# Patient Record
Sex: Female | Born: 1985 | Race: White | Hispanic: No | Marital: Married | Smoking: Former smoker
Health system: Southern US, Community
[De-identification: ages and names within clinical notes are randomized; demographics above are authoritative.]

## PROBLEM LIST (undated history)

## (undated) DIAGNOSIS — T7840XA Allergy, unspecified, initial encounter: Secondary | ICD-10-CM

## (undated) DIAGNOSIS — M1990 Unspecified osteoarthritis, unspecified site: Secondary | ICD-10-CM

## (undated) DIAGNOSIS — I10 Essential (primary) hypertension: Secondary | ICD-10-CM

## (undated) DIAGNOSIS — J45909 Unspecified asthma, uncomplicated: Secondary | ICD-10-CM

## (undated) DIAGNOSIS — J40 Bronchitis, not specified as acute or chronic: Secondary | ICD-10-CM

## (undated) HISTORY — DX: Unspecified osteoarthritis, unspecified site: M19.90

## (undated) HISTORY — PX: WISDOM TOOTH EXTRACTION: SHX21

## (undated) HISTORY — DX: Allergy, unspecified, initial encounter: T78.40XA

## (undated) HISTORY — DX: Essential (primary) hypertension: I10

## (undated) HISTORY — DX: Bronchitis, not specified as acute or chronic: J40

---
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
## 2008-05-25 ENCOUNTER — Emergency Department: Payer: Self-pay | Admitting: Emergency Medicine

## 2008-07-17 ENCOUNTER — Emergency Department: Payer: Self-pay | Admitting: Emergency Medicine

## 2008-08-19 ENCOUNTER — Emergency Department: Payer: Self-pay | Admitting: Emergency Medicine

## 2009-04-05 ENCOUNTER — Emergency Department: Payer: Self-pay | Admitting: Emergency Medicine

## 2009-07-06 ENCOUNTER — Emergency Department: Payer: Self-pay | Admitting: Emergency Medicine

## 2009-07-08 ENCOUNTER — Emergency Department: Payer: Self-pay | Admitting: Emergency Medicine

## 2009-09-05 ENCOUNTER — Emergency Department: Payer: Self-pay | Admitting: Emergency Medicine

## 2009-12-29 ENCOUNTER — Emergency Department: Payer: Self-pay | Admitting: Unknown Physician Specialty

## 2010-01-06 ENCOUNTER — Ambulatory Visit: Payer: Self-pay | Admitting: Internal Medicine

## 2010-01-15 ENCOUNTER — Ambulatory Visit: Payer: Self-pay | Admitting: Internal Medicine

## 2013-02-07 ENCOUNTER — Emergency Department: Payer: Self-pay | Admitting: Emergency Medicine

## 2013-02-07 LAB — COMPREHENSIVE METABOLIC PANEL
ALT: 27 U/L (ref 12–78)
ANION GAP: 6 — AB (ref 7–16)
Albumin: 3.4 g/dL (ref 3.4–5.0)
Alkaline Phosphatase: 64 U/L
BUN: 11 mg/dL (ref 7–18)
Bilirubin,Total: 0.2 mg/dL (ref 0.2–1.0)
CO2: 25 mmol/L (ref 21–32)
Calcium, Total: 8.4 mg/dL — ABNORMAL LOW (ref 8.5–10.1)
Chloride: 105 mmol/L (ref 98–107)
Creatinine: 0.54 mg/dL — ABNORMAL LOW (ref 0.60–1.30)
EGFR (African American): >60
EGFR (Non-African Amer.): >60
Glucose: 95 mg/dL (ref 65–99)
Osmolality: 271 (ref 275–301)
Potassium: 3.7 mmol/L (ref 3.5–5.1)
SGOT(AST): 19 U/L (ref 15–37)
Sodium: 136 mmol/L (ref 136–145)
Total Protein: 6.8 g/dL (ref 6.4–8.2)

## 2013-02-07 LAB — URINALYSIS, COMPLETE
BLOOD: NEGATIVE
Bacteria: NONE SEEN
Bilirubin,UR: NEGATIVE
Glucose,UR: NEGATIVE mg/dL (ref 0–75)
NITRITE: NEGATIVE
Ph: 5 (ref 4.5–8.0)
RBC,UR: NONE SEEN /HPF (ref 0–5)
SPECIFIC GRAVITY: 1.038 (ref 1.003–1.030)
Squamous Epithelial: 6

## 2013-02-07 LAB — CBC WITH DIFFERENTIAL/PLATELET
Basophil #: 0 10*3/uL (ref 0.0–0.1)
Basophil %: 0.5 %
Eosinophil #: 0.1 10*3/uL (ref 0.0–0.7)
Eosinophil %: 1.1 %
HCT: 36 % (ref 35.0–47.0)
HGB: 12 g/dL (ref 12.0–16.0)
Lymphocyte #: 2.6 10*3/uL (ref 1.0–3.6)
Lymphocyte %: 27.4 %
MCH: 29.9 pg (ref 26.0–34.0)
MCHC: 33.2 g/dL (ref 32.0–36.0)
MCV: 90 fL (ref 80–100)
MONO ABS: 0.6 x10 3/mm (ref 0.2–0.9)
Monocyte %: 5.9 %
NEUTROS ABS: 6.3 10*3/uL (ref 1.4–6.5)
NEUTROS PCT: 65.1 %
PLATELETS: 208 10*3/uL (ref 150–440)
RBC: 4.01 10*6/uL (ref 3.80–5.20)
RDW: 12.9 % (ref 11.5–14.5)
WBC: 9.6 10*3/uL (ref 3.6–11.0)

## 2013-02-07 LAB — HCG, QUANTITATIVE, PREGNANCY: Beta Hcg, Quant.: 32953 m[IU]/mL — ABNORMAL HIGH

## 2013-02-07 LAB — LIPASE, BLOOD: Lipase: 67 U/L — ABNORMAL LOW (ref 73–393)

## 2015-02-08 ENCOUNTER — Emergency Department
Admission: EM | Admit: 2015-02-08 | Discharge: 2015-02-08 | Disposition: A | Discharge status: Home / Self Care | Payer: Medicaid Other | Attending: Emergency Medicine | Admitting: Emergency Medicine

## 2015-02-08 ENCOUNTER — Encounter: Payer: Self-pay | Admitting: Emergency Medicine

## 2015-02-08 DIAGNOSIS — K2920 Alcoholic gastritis without bleeding: Secondary | ICD-10-CM | POA: Insufficient documentation

## 2015-02-08 DIAGNOSIS — F17200 Nicotine dependence, unspecified, uncomplicated: Secondary | ICD-10-CM | POA: Insufficient documentation

## 2015-02-08 DIAGNOSIS — R51 Headache: Secondary | ICD-10-CM | POA: Insufficient documentation

## 2015-02-08 MED ORDER — RANITIDINE HCL 150 MG PO TABS: 150.0000 mg | ORAL_TABLET | Freq: Two times a day (BID) | ORAL | Status: DC

## 2015-02-08 MED ORDER — ONDANSETRON HCL 4 MG/2ML IJ SOLN
4.0000 mg | Freq: Once | INTRAMUSCULAR | Status: AC
  Administered 2015-02-08: 4 mg via INTRAVENOUS
  Filled 2015-02-08: qty 2

## 2015-02-08 MED ORDER — SODIUM CHLORIDE 0.9 % IV BOLUS (SEPSIS)
1000.0000 mL | Freq: Once | INTRAVENOUS | Status: AC
  Administered 2015-02-08: 1000 mL via INTRAVENOUS

## 2015-02-08 MED ORDER — ONDANSETRON 4 MG PO TBDP: 4.0000 mg | ORAL_TABLET | Freq: Three times a day (TID) | ORAL | Status: DC | PRN

## 2015-02-08 NOTE — ED Notes (Signed)
Denies any other symptoms, multiple episodes of vomiting, no emesis while here. Sleeping in waiting room

## 2015-02-08 NOTE — ED Provider Notes (Signed)
[facility] Emergency Department Provider Note  ____________________________________________  Time seen: Approximately 4:00 PM  I have reviewed the triage vital signs and the nursing notes.   HISTORY  Chief Complaint Emesis   HPI Emma Hamilton is a 30 y.o. female is here with complaint of multiple episodes of vomiting. Patient admits to drinking numerous shots of a alcohol product last evening. She states that during the evening that she became belligerent with her boyfriend. She states she went to sleep and when she woke up and drank some water and then began drinking alcohol once again. At some point she went back to sleep and at one p.m. today woke up and was going to drive her boyfriend to work when she began vomiting once again. She denies any previous GI problems and denies any peptic ulcer disease. She has not taken any medication for vomiting.  Currently she feels nauseous. She denies possibility of pregnancy due to Implanon implant.   History reviewed. No pertinent past medical history.  There are no active problems to display for this patient.   History reviewed. No pertinent past surgical history.  Current Outpatient Rx  Name  Route  Sig  Dispense  Refill  . ondansetron (ZOFRAN ODT) 4 MG disintegrating tablet   Oral   Take 1 tablet (4 mg total) by mouth every 8 (eight) hours as needed for nausea or vomiting.   8 tablet   0   . ranitidine (ZANTAC) 150 MG tablet   Oral   Take 1 tablet (150 mg total) by mouth 2 (two) times daily.   14 tablet   1     Allergies Review of patient's allergies indicates no known allergies.  History reviewed. No pertinent family history.  Social History Social History  Substance Use Topics  . Smoking status: Current Every Day Smoker -- 0.50 packs/day  . Smokeless tobacco: None  . Alcohol Use: Yes    Review of Systems Constitutional: No fever/chills Eyes: No visual changes. ENT: No sore  throat. Cardiovascular: Denies chest pain. Respiratory: Denies shortness of breath. Gastrointestinal: No abdominal pain.  Positive nausea, positive vomiting.  No diarrhea.  No constipation. Genitourinary: Negative for dysuria. Musculoskeletal: Negative for back pain. Skin: Negative for rash. Neurological: Positive for headaches, no focal weakness or numbness.  10-point ROS otherwise negative.  ____________________________________________   PHYSICAL EXAM:  VITAL SIGNS: ED Triage Vitals  Enc Vitals Group     BP 02/08/15 1517 124/81 mmHg     Pulse Rate 02/08/15 1517 82     Resp 02/08/15 1517 16     Temp 02/08/15 1517 98.2 F (36.8 C)     Temp src --      SpO2 02/08/15 1517 100 %     Weight 02/08/15 1517 240 lb (108.863 kg)     Height 02/08/15 1517  (1.6 m)     Head Cir --      Peak Flow --      Pain Score --      Pain Loc --      Pain Edu? --      Excl. in GC? --     Constitutional: Alert and oriented. Well appearing and in no acute distress. Eyes: Conjunctivae are normal. PERRL. EOMI. Head: Atraumatic. Nose: No congestion/rhinnorhea. Mouth/Throat: Mucous membranes are moist.  Oropharynx non-erythematous. Neck: No stridor.   Hematological/Lymphatic/Immunilogical: No cervical lymphadenopathy. Cardiovascular: Normal rate, regular rhythm. Grossly normal heart sounds.  Good peripheral circulation. Respiratory: Normal respiratory effort.  No retractions. Lungs CTAB. Gastrointestinal: Soft and nontender. No distention.  Musculoskeletal: No lower extremity tenderness nor edema.  No joint effusions. Neurologic:  Normal speech and language. No gross focal neurologic deficits are appreciated. No gait instability. Skin:  Skin is warm, dry and intact. No rash noted. Psychiatric: Mood and affect are normal. Speech and behavior are normal.  ____________________________________________   LABS (all labs ordered are listed, but only abnormal results are displayed)  Labs  Reviewed - No data to display  PROCEDURES  Procedure(s) performed: None  Critical Care performed: No  ____________________________________________   INITIAL IMPRESSION / ASSESSMENT AND PLAN / ED COURSE  Pertinent labs & imaging results that were available during my care of the patient were reviewed by me and considered in my medical decision making (see chart for details).  ----------------------------------------- 5:36 PM on 02/08/2015 ----------------------------------------- No continued nausea or vomiting. Patient continues to have IV fluids and voices no other complaints.  Patient was given IV Zofran along with a liter of fluids and began feeling much better. She was given a prescription for Zofran and told no alcohol and even after a week only minimal amounts instead of the large amount but it sounds as she had last night. She is to follow-up with [facility] if any continued problems.  ____________________________________________   FINAL CLINICAL IMPRESSION(S) / ED DIAGNOSES  Final diagnoses:  Acute alcoholic gastritis without hemorrhage      Tommi Rumps, PA-C 02/08/15 1907  Myrna Blazer, MD 02/08/15 2241

## 2015-08-05 ENCOUNTER — Encounter: Payer: Self-pay | Admitting: Emergency Medicine

## 2015-08-05 ENCOUNTER — Emergency Department
Admission: EM | Admit: 2015-08-05 | Discharge: 2015-08-05 | Disposition: A | Discharge status: Home / Self Care | Payer: Medicaid Other | Attending: Emergency Medicine | Admitting: Emergency Medicine

## 2015-08-05 ENCOUNTER — Emergency Department: Payer: Medicaid Other

## 2015-08-05 DIAGNOSIS — J209 Acute bronchitis, unspecified: Secondary | ICD-10-CM | POA: Insufficient documentation

## 2015-08-05 DIAGNOSIS — J45909 Unspecified asthma, uncomplicated: Secondary | ICD-10-CM | POA: Insufficient documentation

## 2015-08-05 DIAGNOSIS — J40 Bronchitis, not specified as acute or chronic: Secondary | ICD-10-CM

## 2015-08-05 DIAGNOSIS — Z79899 Other long term (current) drug therapy: Secondary | ICD-10-CM | POA: Insufficient documentation

## 2015-08-05 DIAGNOSIS — R05 Cough: Secondary | ICD-10-CM

## 2015-08-05 DIAGNOSIS — R059 Cough, unspecified: Secondary | ICD-10-CM

## 2015-08-05 DIAGNOSIS — F17200 Nicotine dependence, unspecified, uncomplicated: Secondary | ICD-10-CM | POA: Insufficient documentation

## 2015-08-05 HISTORY — DX: Unspecified asthma, uncomplicated: J45.909

## 2015-08-05 MED ORDER — BENZONATATE 100 MG PO CAPS: 100.0000 mg | ORAL_CAPSULE | Freq: Four times a day (QID) | ORAL | Status: DC | PRN

## 2015-08-05 MED ORDER — ALBUTEROL SULFATE HFA 108 (90 BASE) MCG/ACT IN AERS: 2.0000 | INHALATION_SPRAY | Freq: Four times a day (QID) | RESPIRATORY_TRACT | Status: DC | PRN

## 2015-08-05 MED ORDER — PREDNISONE 20 MG PO TABS
60.0000 mg | ORAL_TABLET | Freq: Once | ORAL | Status: AC
  Administered 2015-08-05: 60 mg via ORAL
  Filled 2015-08-05: qty 3

## 2015-08-05 MED ORDER — IPRATROPIUM-ALBUTEROL 0.5-2.5 (3) MG/3ML IN SOLN
3.0000 mL | Freq: Once | RESPIRATORY_TRACT | Status: AC
  Administered 2015-08-05: 3 mL via RESPIRATORY_TRACT
  Filled 2015-08-05: qty 3

## 2015-08-05 MED ORDER — PREDNISONE 20 MG PO TABS: 60.0000 mg | ORAL_TABLET | Freq: Every day | ORAL | Status: DC

## 2015-08-05 NOTE — ED Notes (Signed)
Pt arrived to the ED accompanied by her husband for complaints of cough and vomiting. Pt is AOx4 in no apparent distress.

## 2015-08-05 NOTE — ED Provider Notes (Signed)
[facility] Emergency Department Provider Note   ____________________________________________  Time seen: Approximately 432 AM  I have reviewed the triage vital signs and the nursing notes.   HISTORY  Chief Complaint Cough    HPI Malisha Marjean DonnaLujan Dickson is a 30 y.o. female who comes into the hospital today with a cough. The patient reports that she is having trouble falling asleep and trouble laying down. The patient reports that she has coughing fits and is been going on for the past 4 days. The patient reports that she has some difficulty breathing whenever she coughs. He has not been as intense during the day as it has been during the night. The patient's had no fevers but has had some nausea and diarrhea. She has not been taking anything for the cough. The patient does not have a primary care physician. She reports that her son has been sick. She reports she does have some pain in the middle of her chest when she coughs but she has no pain at this time. The patient had a headache and reports that the cough is worse when she is laying flat or on her side. The patient is here for evaluation today.   Past Medical History  Diagnosis Date  . Asthma     There are no active problems to display for this patient.   History reviewed. No pertinent past surgical history.  Current Outpatient Rx  Name  Route  Sig  Dispense  Refill  . albuterol (PROVENTIL HFA;VENTOLIN HFA) 108 (90 Base) MCG/ACT inhaler   Inhalation   Inhale 2 puffs into the lungs every 6 (six) hours as needed.   1 Inhaler   0   . benzonatate (TESSALON PERLES) 100 MG capsule   Oral   Take 1 capsule (100 mg total) by mouth every 6 (six) hours as needed for cough.   15 capsule   0   . ondansetron (ZOFRAN ODT) 4 MG disintegrating tablet   Oral   Take 1 tablet (4 mg total) by mouth every 8 (eight) hours as needed for nausea or vomiting.   8 tablet   0   . predniSONE (DELTASONE) 20 MG  tablet   Oral   Take 3 tablets (60 mg total) by mouth daily.   12 tablet   0   . ranitidine (ZANTAC) 150 MG tablet   Oral   Take 1 tablet (150 mg total) by mouth 2 (two) times daily.   14 tablet   1     Allergies Review of patient's allergies indicates no known allergies.  History reviewed. No pertinent family history.  Social History Social History  Substance Use Topics  . Smoking status: Current Every Day Smoker -- 0.50 packs/day  . Smokeless tobacco: None  . Alcohol Use: Yes    Review of Systems Constitutional: No fever/chills Eyes: No visual changes. ENT: No sore throat. Cardiovascular:  chest pain. Respiratory: Cough and shortness of breath. Gastrointestinal: No abdominal pain.  No nausea, no vomiting.  No diarrhea.  No constipation. Genitourinary: Negative for dysuria. Musculoskeletal: Negative for back pain. Skin: Negative for rash. Neurological: Headache  10-point ROS otherwise negative.  ____________________________________________   PHYSICAL EXAM:  VITAL SIGNS: ED Triage Vitals  Enc Vitals Group     BP 08/05/15 0311 141/97 mmHg     Pulse Rate 08/05/15 0311 88     Resp 08/05/15 0311 20     Temp 08/05/15 0311 98.2 F (36.8 C)     Temp  Source 08/05/15 0311 Oral     SpO2 08/05/15 0311 98 %     Weight 08/05/15 0311 299 lb (135.626 kg)     Height 08/05/15 0311  (1.6 m)     Head Cir --      Peak Flow --      Pain Score 08/05/15 0312 0     Pain Loc --      Pain Edu? --      Excl. in GC? --     Constitutional: Alert and oriented. Well appearing and in Mild distress. Eyes: Conjunctivae are normal. PERRL. EOMI. Head: Atraumatic. Nose: No congestion/rhinnorhea. Mouth/Throat: Mucous membranes are moist.  Oropharynx non-erythematous. Cardiovascular: Normal rate, regular rhythm. Grossly normal heart sounds.  Good peripheral circulation. Respiratory: Normal respiratory effort.  No retractions. Mild expiratory wheezes throughout all lung  fields Gastrointestinal: Soft and nontender. No distention. Positive bowel sounds Musculoskeletal: No lower extremity tenderness nor edema.   Neurologic:  Normal speech and language.  Skin:  Skin is warm, dry and intact.  Psychiatric: Mood and affect are normal.   ____________________________________________   LABS (all labs ordered are listed, but only abnormal results are displayed)  Labs Reviewed - No data to display ____________________________________________  EKG  none ____________________________________________  RADIOLOGY  Chest x-ray: No active cardiopulmonary disease. ____________________________________________   PROCEDURES  Procedure(s) performed: None  Procedures  Critical Care performed: No  ____________________________________________   INITIAL IMPRESSION / ASSESSMENT AND PLAN / ED COURSE  Pertinent labs & imaging results that were available during my care of the patient were reviewed by me and considered in my medical decision making (see chart for details).  This is a 30 year old female who comes into the hospital today with some cough. The patient does have some wheezing on exam. The patient's chest x-ray does not show a pneumonia but she likely has bronchitis. I will give the patient a DuoNeb as well as a dose of prednisone and she will be [facility]  Patient's wheezes are improved. ____________________________________________   FINAL CLINICAL IMPRESSION(S) / ED DIAGNOSES  Final diagnoses:  Bronchitis  Cough      NEW MEDICATIONS STARTED DURING THIS VISIT:  New Prescriptions   ALBUTEROL (PROVENTIL HFA;VENTOLIN HFA) 108 (90 BASE) MCG/ACT INHALER    Inhale 2 puffs into the lungs every 6 (six) hours as needed.   BENZONATATE (TESSALON PERLES) 100 MG CAPSULE    Take 1 capsule (100 mg total) by mouth every 6 (six) hours as needed for cough.   PREDNISONE (DELTASONE) 20 MG TABLET    Take 3 tablets (60 mg  total) by mouth daily.     Note:  This document was prepared using Dragon voice recognition software and may include unintentional dictation errors.    Rebecka Apley, MD 08/05/15 [id number]

## 2015-08-05 NOTE — Discharge Instructions (Signed)
Cough, Adult °Coughing is a reflex that clears your throat and your airways. Coughing helps to heal and protect your lungs. It is normal to cough occasionally, but a cough that happens with other symptoms or lasts a long time may be a sign of a condition that needs treatment. A cough may last only 2-3 weeks (acute), or it may last longer than 8 weeks (chronic). °CAUSES °Coughing is commonly caused by: °· Breathing in substances that irritate your lungs. °· A viral or bacterial respiratory infection. °· Allergies. °· Asthma. °· Postnasal drip. °· Smoking. °· Acid backing up from the stomach into the esophagus (gastroesophageal reflux). °· Certain medicines. °· Chronic lung problems, including COPD (or rarely, lung cancer). °· Other medical conditions such as heart failure. °HOME CARE INSTRUCTIONS  °Pay attention to any changes in your symptoms. Take these actions to help with your discomfort: °· Take medicines only as told by your health care provider. °· If you were prescribed an antibiotic medicine, take it as told by your health care provider. Do not stop taking the antibiotic even if you start to feel better. °· Talk with your health care provider before you take a cough suppressant medicine. °· Drink enough fluid to keep your urine clear or pale yellow. °· If the air is dry, use a cold steam vaporizer or humidifier in your bedroom or your home to help loosen secretions. °· Avoid anything that causes you to cough at work or at home. °· If your cough is worse at night, try sleeping in a semi-upright position. °· Avoid cigarette smoke. If you smoke, quit smoking. If you need help quitting, ask your health care provider. °· Avoid caffeine. °· Avoid alcohol. °· Rest as needed. °SEEK MEDICAL CARE IF:  °· You have new symptoms. °· You cough up pus. °· Your cough does not get better after 2-3 weeks, or your cough gets worse. °· You cannot control your cough with suppressant medicines and you are losing sleep. °· You  develop pain that is getting worse or pain that is not controlled with pain medicines. °· You have a fever. °· You have unexplained weight loss. °· You have night sweats. °SEEK IMMEDIATE MEDICAL CARE IF: °· You cough up blood. °· You have difficulty breathing. °· Your heartbeat is very fast. °  °This information is not intended to replace advice given to you by your health care provider. Make sure you discuss any questions you have with your health care provider. °  °Document Released: 07/02/2010 Document Revised: 09/24/2014 Document Reviewed: 03/12/2014 °Elsevier Interactive Patient Education ©2016 [employer] ° °Upper Respiratory Infection, Adult °Most upper respiratory infections (URIs) are a viral infection of the air passages leading to the lungs. A URI affects the nose, throat, and upper air passages. The most common type of URI is nasopharyngitis and is typically referred to as "the common cold." °URIs run their course and usually go away on their own. Most of the time, a URI does not require medical attention, but sometimes a bacterial infection in the upper airways can follow a viral infection. This is called a secondary infection. Sinus and middle ear infections are common types of secondary upper respiratory infections. °Bacterial pneumonia can also complicate a URI. A URI can worsen asthma and chronic obstructive pulmonary disease (COPD). Sometimes, these complications can require emergency medical care and may be life threatening.  °CAUSES °Almost all URIs are caused by viruses. A virus is a type of germ and can spread from one   person to another.  °RISKS FACTORS °You may be at risk for a URI if:  °· You smoke.   °· You have chronic heart or lung disease. °· You have a weakened defense (immune) system.   °· You are very young or very old.   °· You have nasal allergies or asthma. °· You work in crowded or poorly ventilated areas. °· You work in health care facilities or schools. °SIGNS AND SYMPTOMS    °Symptoms typically develop 2-3 days after you come in contact with a cold virus. Most viral URIs last 7-10 days. However, viral URIs from the influenza virus (flu virus) can last 14-18 days and are typically more severe. Symptoms may include:  °· Runny or stuffy (congested) nose.   °· Sneezing.   °· Cough.   °· Sore throat.   °· Headache.   °· Fatigue.   °· Fever.   °· Loss of appetite.   °· Pain in your forehead, behind your eyes, and over your cheekbones (sinus pain). °· Muscle aches.   °DIAGNOSIS  °Your health care provider may diagnose a URI by: °· Physical exam. °· Tests to check that your symptoms are not due to another condition such as: °¨ Strep throat. °¨ Sinusitis. °¨ Pneumonia. °¨ Asthma. °TREATMENT  °A URI goes away on its own with time. It cannot be cured with medicines, but medicines may be prescribed or recommended to relieve symptoms. Medicines may help: °· Reduce your fever. °· Reduce your cough. °· Relieve nasal congestion. °HOME CARE INSTRUCTIONS  °· Take medicines only as directed by your health care provider.   °· Gargle warm saltwater or take cough drops to comfort your throat as directed by your health care provider. °· Use a warm mist humidifier or inhale steam from a shower to increase air moisture. This may make it easier to breathe. °· Drink enough fluid to keep your urine clear or pale yellow.   °· Eat soups and other clear broths and maintain good nutrition.   °· Rest as needed.   °· Return to work when your temperature has returned to normal or as your health care provider advises. You may need to stay home longer to avoid infecting others. You can also use a face mask and careful hand washing to prevent spread of the virus. °· Increase the usage of your inhaler if you have asthma.   °· Do not use any tobacco products, including cigarettes, chewing tobacco, or electronic cigarettes. If you need help quitting, ask your health care provider. °PREVENTION  °The best way to protect  yourself from getting a cold is to practice good hygiene.  °· Avoid oral or hand contact with people with cold symptoms.   °· Wash your hands often if contact occurs.   °There is no clear evidence that vitamin C, vitamin E, echinacea, or exercise reduces the chance of developing a cold. However, it is always recommended to get plenty of rest, exercise, and practice good nutrition.  °SEEK MEDICAL CARE IF:  °· You are getting worse rather than better.   °· Your symptoms are not controlled by medicine.   °· You have chills. °· You have worsening shortness of breath. °· You have brown or red mucus. °· You have yellow or brown nasal discharge. °· You have pain in your face, especially when you bend forward. °· You have a fever. °· You have swollen neck glands. °· You have pain while swallowing. °· You have white areas in the back of your throat. °SEEK IMMEDIATE MEDICAL CARE IF:  °· You have severe or persistent: °¨ Headache. °¨ Ear pain. °¨   Sinus pain. °¨ Chest pain. °· You have chronic lung disease and any of the following: °¨ Wheezing. °¨ Prolonged cough. °¨ Coughing up blood. °¨ A change in your usual mucus. °· You have a stiff neck. °· You have changes in your: °¨ Vision. °¨ Hearing. °¨ Thinking. °¨ Mood. °MAKE SURE YOU:  °· Understand these instructions. °· Will watch your condition. °· Will get help right away if you are not doing well or get worse. °  °This information is not intended to replace advice given to you by your health care provider. Make sure you discuss any questions you have with your health care provider. °  °Document Released: 06/29/2000 Document Revised: 05/20/2014 Document Reviewed: 04/10/2013 °Elsevier Interactive Patient Education ©2016 [employer] ° °

## 2017-02-22 ENCOUNTER — Other Ambulatory Visit: Payer: Self-pay

## 2017-02-22 ENCOUNTER — Encounter: Payer: Self-pay | Admitting: Emergency Medicine

## 2017-02-22 DIAGNOSIS — F17200 Nicotine dependence, unspecified, uncomplicated: Secondary | ICD-10-CM | POA: Insufficient documentation

## 2017-02-22 DIAGNOSIS — L03116 Cellulitis of left lower limb: Secondary | ICD-10-CM | POA: Insufficient documentation

## 2017-02-22 DIAGNOSIS — Y999 Unspecified external cause status: Secondary | ICD-10-CM | POA: Insufficient documentation

## 2017-02-22 DIAGNOSIS — W57XXXA Bitten or stung by nonvenomous insect and other nonvenomous arthropods, initial encounter: Secondary | ICD-10-CM | POA: Insufficient documentation

## 2017-02-22 DIAGNOSIS — J45909 Unspecified asthma, uncomplicated: Secondary | ICD-10-CM | POA: Insufficient documentation

## 2017-02-22 DIAGNOSIS — Y939 Activity, unspecified: Secondary | ICD-10-CM | POA: Insufficient documentation

## 2017-02-22 DIAGNOSIS — Y929 Unspecified place or not applicable: Secondary | ICD-10-CM | POA: Insufficient documentation

## 2017-02-22 DIAGNOSIS — L089 Local infection of the skin and subcutaneous tissue, unspecified: Secondary | ICD-10-CM | POA: Insufficient documentation

## 2017-02-22 NOTE — ED Triage Notes (Addendum)
Pt to triage via w/c with no distress noted; pt st ?spider bite to left lower leg noted yesterday now with redness and swelling to site

## 2017-02-23 ENCOUNTER — Emergency Department
Admission: EM | Admit: 2017-02-23 | Discharge: 2017-02-23 | Disposition: A | Discharge status: Home / Self Care | Payer: Self-pay | Attending: Emergency Medicine | Admitting: Emergency Medicine

## 2017-02-23 DIAGNOSIS — W57XXXA Bitten or stung by nonvenomous insect and other nonvenomous arthropods, initial encounter: Secondary | ICD-10-CM

## 2017-02-23 DIAGNOSIS — L03116 Cellulitis of left lower limb: Secondary | ICD-10-CM

## 2017-02-23 DIAGNOSIS — L089 Local infection of the skin and subcutaneous tissue, unspecified: Secondary | ICD-10-CM

## 2017-02-23 DIAGNOSIS — S80862A Insect bite (nonvenomous), left lower leg, initial encounter: Secondary | ICD-10-CM

## 2017-02-23 LAB — COMPREHENSIVE METABOLIC PANEL
ALT: 22 U/L (ref 14–54)
AST: 20 U/L (ref 15–41)
Albumin: 3.8 g/dL (ref 3.5–5.0)
Alkaline Phosphatase: 79 U/L (ref 38–126)
Anion gap: 9 (ref 5–15)
BILIRUBIN TOTAL: 0.3 mg/dL (ref 0.3–1.2)
BUN: 17 mg/dL (ref 6–20)
CHLORIDE: 105 mmol/L (ref 101–111)
CO2: 22 mmol/L (ref 22–32)
CREATININE: 0.68 mg/dL (ref 0.44–1.00)
Calcium: 8.6 mg/dL — ABNORMAL LOW (ref 8.9–10.3)
GFR calc Af Amer: >60 mL/min (ref >60)
Glucose, Bld: 122 mg/dL — ABNORMAL HIGH (ref 65–99)
Potassium: 3.8 mmol/L (ref 3.5–5.1)
Sodium: 136 mmol/L (ref 135–145)
TOTAL PROTEIN: 6.9 g/dL (ref 6.5–8.1)

## 2017-02-23 LAB — CBC
HEMATOCRIT: 37.8 % (ref 35.0–47.0)
Hemoglobin: 12.4 g/dL (ref 12.0–16.0)
MCH: 29.1 pg (ref 26.0–34.0)
MCHC: 32.7 g/dL (ref 32.0–36.0)
MCV: 89 fL (ref 80.0–100.0)
Platelets: 264 10*3/uL (ref 150–440)
RBC: 4.25 MIL/uL (ref 3.80–5.20)
RDW: 13.8 % (ref 11.5–14.5)
WBC: 13.8 10*3/uL — AB (ref 3.6–11.0)

## 2017-02-23 LAB — LACTIC ACID, PLASMA: Lactic Acid, Venous: 1 mmol/L (ref 0.5–1.9)

## 2017-02-23 MED ORDER — OXYCODONE-ACETAMINOPHEN 5-325 MG PO TABS: 1.0000 | ORAL_TABLET | ORAL | 0 refills | Status: DC | PRN

## 2017-02-23 MED ORDER — SULFAMETHOXAZOLE-TRIMETHOPRIM 800-160 MG PO TABS: 1.0000 | ORAL_TABLET | Freq: Two times a day (BID) | ORAL | 0 refills | Status: AC

## 2017-02-23 MED ORDER — SULFAMETHOXAZOLE-TRIMETHOPRIM 800-160 MG PO TABS
1.0000 | ORAL_TABLET | Freq: Once | ORAL | Status: AC
  Administered 2017-02-23: 1 via ORAL
  Filled 2017-02-23: qty 1

## 2017-02-23 MED ORDER — OXYCODONE-ACETAMINOPHEN 5-325 MG PO TABS
1.0000 | ORAL_TABLET | Freq: Once | ORAL | Status: AC
  Administered 2017-02-23: 1 via ORAL
  Filled 2017-02-23: qty 1

## 2017-02-23 NOTE — ED Provider Notes (Signed)
[facility] Emergency Department Provider Note    First MD Initiated Contact with Patient 02/23/17 [phone]     (approximate)  I have reviewed the triage vital signs and the nursing notes.   HISTORY  Chief Complaint Abscess    HPI Emma Hamilton is a 32 y.o. female resents to the emergency department with history of possible insect bite to the left leg which occurred 3 days ago.  Patient states progressive pain swelling and redness noted at the site since incident.  Patient states there was a "bump on the site but she squeezed it with resultant purulent drainage.  Patient admits to subjective fever and chills   Past Medical History:  Diagnosis Date  . Asthma     There are no active problems to display for this patient.   Past Surgical History:  Procedure Laterality Date  . CESAREAN SECTION      Prior to Admission medications   Medication Sig Start Date End Date Taking? Authorizing Provider  albuterol (PROVENTIL HFA;VENTOLIN HFA) 108 (90 Base) MCG/ACT inhaler Inhale 2 puffs into the lungs every 6 (six) hours as needed. 08/05/15   Rebecka Apley, MD  benzonatate (TESSALON PERLES) 100 MG capsule Take 1 capsule (100 mg total) by mouth every 6 (six) hours as needed for cough. 08/05/15   Rebecka Apley, MD  ondansetron (ZOFRAN ODT) 4 MG disintegrating tablet Take 1 tablet (4 mg total) by mouth every 8 (eight) hours as needed for nausea or vomiting. 02/08/15   Tommi Rumps, PA-C  oxyCODONE-acetaminophen (PERCOCET) 5-325 MG tablet Take 1 tablet by mouth every 4 (four) hours as needed for severe pain. 02/23/17   Darci Current, MD  predniSONE (DELTASONE) 20 MG tablet Take 3 tablets (60 mg total) by mouth daily. 08/05/15   Rebecka Apley, MD  ranitidine (ZANTAC) 150 MG tablet Take 1 tablet (150 mg total) by mouth 2 (two) times daily. 02/08/15 02/08/16  Tommi Rumps, PA-C  sulfamethoxazole-trimethoprim (BACTRIM DS,SEPTRA DS) 800-160 MG tablet  Take 1 tablet by mouth 2 (two) times daily for 10 days. 02/23/17 03/05/17  Darci Current, MD    Allergies No known drug allergies No family history on file.  Social History Social History   Tobacco Use  . Smoking status: Current Every Day Smoker    Packs/day: 0.50  . Smokeless tobacco: Never Used  Substance Use Topics  . Alcohol use: Yes  . Drug use: Not on file    Review of Systems Constitutional: No fever/chills Eyes: No visual changes. ENT: No sore throat. Cardiovascular: Denies chest pain. Respiratory: Denies shortness of breath. Gastrointestinal: No abdominal pain.  No nausea, no vomiting.  No diarrhea.  No constipation. Genitourinary: Negative for dysuria. Musculoskeletal: Negative for neck pain.  Negative for back pain. Integumentary: Positive for left leg pain swelling and redness Neurological: Negative for headaches, focal weakness or numbness.   ____________________________________________   PHYSICAL EXAM:  VITAL SIGNS: ED Triage Vitals  Enc Vitals Group     BP 02/22/17 2355 (!) 151/92     Pulse Rate 02/22/17 2355 (!) 110     Resp 02/22/17 2355 20     Temp 02/22/17 2355 98.9 F (37.2 C)     Temp Source 02/22/17 2355 Oral     SpO2 02/22/17 2355 99 %     Weight 02/22/17 2354 136.1 kg (300 lb)     Height 02/22/17 2354 1.6 m (5\' 3" )     Head Circumference --  Peak Flow --      Pain Score 02/22/17 2358 8     Pain Loc --      Pain Edu? --      Excl. in GC? --     Constitutional: Alert and oriented. Well appearing and in no acute distress. Eyes: Conjunctivae are normal.  Head: Atraumatic. Mouth/Throat: Mucous membranes are moist.  Oropharynx non-erythematous. Neck: No stridor.  Cardiovascular: Normal rate, regular rhythm. Good peripheral circulation. Grossly normal heart sounds. Respiratory: Normal respiratory effort.  No retractions. Lungs CTAB. Gastrointestinal: Soft and nontender. No distention.   Musculoskeletal: No lower extremity  tenderness nor edema. No gross deformities of extremities. Neurologic:  Normal speech and language. No gross focal neurologic deficits are appreciated.  Skin: 12 x 8 cm area of blanching erythema left lateral lower extremity with Psychiatric: Mood and affect are normal. Speech and behavior are normal.  ____________________________________________   LABS (all labs ordered are listed, but only abnormal results are displayed)  Labs Reviewed  CBC - Abnormal; Notable for the following components:      Result Value   WBC 13.8 (*)    All other components within normal limits  COMPREHENSIVE METABOLIC PANEL - Abnormal; Notable for the following components:   Glucose, Bld 122 (*)    Calcium 8.6 (*)    All other components within normal limits  CULTURE, BLOOD (ROUTINE X 2)  CULTURE, BLOOD (ROUTINE X 2)  LACTIC ACID, PLASMA  LACTIC ACID, PLASMA     Procedures   ____________________________________________   INITIAL IMPRESSION / ASSESSMENT AND PLAN / ED COURSE  As part of my medical decision making, I reviewed the following data within the electronic MEDICAL RECORD [id number] year old female presenting with history and physical exam consistent with left lower extremity cellulitis most likely secondary from an insect bite.  Patient given Bactrim DS and Percocet in the emergency department.  Spoke with the patient at length regarding warning signs that would warrant immediate return to the emergency department. ____________________________________________  FINAL CLINICAL IMPRESSION(S) / ED DIAGNOSES  Final diagnoses:  Left leg cellulitis  Infected insect bite of left lower extremity, initial encounter     MEDICATIONS GIVEN DURING THIS VISIT:  Medications  oxyCODONE-acetaminophen (PERCOCET/ROXICET) 5-325 MG per tablet 1 tablet (1 tablet Oral Given 02/23/17 0517)  sulfamethoxazole-trimethoprim (BACTRIM DS,SEPTRA DS) 800-160 MG per tablet 1 tablet (1 tablet Oral Given 02/23/17 0517)     ED  Discharge Orders        Ordered    sulfamethoxazole-trimethoprim (BACTRIM DS,SEPTRA DS) 800-160 MG tablet  2 times daily     02/23/17 0502    oxyCODONE-acetaminophen (PERCOCET) 5-325 MG tablet  Every 4 hours PRN     02/23/17 0502       Note:  This document was prepared using Dragon voice recognition software and may include unintentional dictation errors.    Darci CurrentBrown, [place]proof N, MD 02/23/17 [phone]

## 2017-02-23 NOTE — ED Notes (Signed)
Pt thinks she has been bitten by a bug of some sort. She noticed a bump on her left lower leg on Tuesday night and by Wed it was swollen and red. Family at bedside.

## 2017-02-24 ENCOUNTER — Other Ambulatory Visit: Payer: Self-pay

## 2017-02-24 ENCOUNTER — Encounter: Payer: Self-pay | Admitting: Emergency Medicine

## 2017-02-24 ENCOUNTER — Emergency Department
Admission: EM | Admit: 2017-02-24 | Discharge: 2017-02-24 | Disposition: A | Discharge status: Home / Self Care | Payer: Self-pay | Attending: Emergency Medicine | Admitting: Emergency Medicine

## 2017-02-24 DIAGNOSIS — J45909 Unspecified asthma, uncomplicated: Secondary | ICD-10-CM | POA: Insufficient documentation

## 2017-02-24 DIAGNOSIS — L03116 Cellulitis of left lower limb: Secondary | ICD-10-CM | POA: Insufficient documentation

## 2017-02-24 DIAGNOSIS — F17200 Nicotine dependence, unspecified, uncomplicated: Secondary | ICD-10-CM | POA: Insufficient documentation

## 2017-02-24 DIAGNOSIS — Z79899 Other long term (current) drug therapy: Secondary | ICD-10-CM | POA: Insufficient documentation

## 2017-02-24 LAB — COMPREHENSIVE METABOLIC PANEL
ALK PHOS: 73 U/L (ref 38–126)
ALT: 21 U/L (ref 14–54)
AST: 26 U/L (ref 15–41)
Albumin: 3.8 g/dL (ref 3.5–5.0)
Anion gap: 8 (ref 5–15)
BUN: 14 mg/dL (ref 6–20)
CALCIUM: 8.6 mg/dL — AB (ref 8.9–10.3)
CO2: 23 mmol/L (ref 22–32)
Chloride: 107 mmol/L (ref 101–111)
Creatinine, Ser: 0.77 mg/dL (ref 0.44–1.00)
GFR calc Af Amer: >60 mL/min (ref >60)
GFR calc non Af Amer: >60 mL/min (ref >60)
Glucose, Bld: 96 mg/dL (ref 65–99)
Potassium: 4.6 mmol/L (ref 3.5–5.1)
SODIUM: 138 mmol/L (ref 135–145)
TOTAL PROTEIN: 7.1 g/dL (ref 6.5–8.1)
Total Bilirubin: 1.2 mg/dL (ref 0.3–1.2)

## 2017-02-24 LAB — CBC WITH DIFFERENTIAL/PLATELET
BASOS ABS: 0 10*3/uL (ref 0–0.1)
BASOS PCT: 0 %
Eosinophils Absolute: 0.1 10*3/uL (ref 0–0.7)
Eosinophils Relative: 1 %
HEMATOCRIT: 36.8 % (ref 35.0–47.0)
Hemoglobin: 12 g/dL (ref 12.0–16.0)
Lymphocytes Relative: 15 %
Lymphs Abs: 2.1 10*3/uL (ref 1.0–3.6)
MCH: 29.2 pg (ref 26.0–34.0)
MCHC: 32.7 g/dL (ref 32.0–36.0)
MCV: 89.3 fL (ref 80.0–100.0)
MONOS PCT: 6 %
Monocytes Absolute: 0.8 10*3/uL (ref 0.2–0.9)
NEUTROS ABS: 11.3 10*3/uL — AB (ref 1.4–6.5)
Neutrophils Relative %: 78 %
Platelets: 258 10*3/uL (ref 150–440)
RBC: 4.12 MIL/uL (ref 3.80–5.20)
RDW: 14.1 % (ref 11.5–14.5)
WBC: 14.4 10*3/uL — ABNORMAL HIGH (ref 3.6–11.0)

## 2017-02-24 MED ORDER — VANCOMYCIN HCL IN DEXTROSE 1-5 GM/200ML-% IV SOLN
1000.0000 mg | Freq: Once | INTRAVENOUS | Status: AC
  Administered 2017-02-24: 1000 mg via INTRAVENOUS

## 2017-02-24 MED ORDER — DIPHENHYDRAMINE HCL 50 MG/ML IJ SOLN
25.0000 mg | Freq: Once | INTRAMUSCULAR | Status: AC
  Administered 2017-02-24: 25 mg via INTRAVENOUS
  Filled 2017-02-24: qty 1

## 2017-02-24 MED ORDER — VANCOMYCIN HCL IN DEXTROSE 1-5 GM/200ML-% IV SOLN
INTRAVENOUS | Status: AC
  Filled 2017-02-24: qty 200

## 2017-02-24 MED ORDER — CEPHALEXIN 500 MG PO CAPS: 500.0000 mg | ORAL_CAPSULE | Freq: Two times a day (BID) | ORAL | 0 refills | Status: DC

## 2017-02-24 NOTE — ED Provider Notes (Addendum)
[facility] Emergency Department Provider Note   ____________________________________________    I have reviewed the triage vital signs and the nursing notes.   HISTORY  Chief Complaint Cellulitis     HPI Emma Hamilton is a 32 y.o. female who presents for recheck of cellulitis.  Patient seen less than 48 hours ago and diagnosed with cellulitis of the left lower leg, treated with Bactrim, she has been compliant with her medications but reports redness has extended beyond the circled area.  Denies fevers or chills.  No nausea or vomiting.  She is not diabetic.  She has noted some discharge from the center of the area   Past Medical History:  Diagnosis Date  . Asthma     There are no active problems to display for this patient.   Past Surgical History:  Procedure Laterality Date  . CESAREAN SECTION      Prior to Admission medications   Medication Sig Start Date End Date Taking? Authorizing Provider  albuterol (PROVENTIL HFA;VENTOLIN HFA) 108 (90 Base) MCG/ACT inhaler Inhale 2 puffs into the lungs every 6 (six) hours as needed. 08/05/15   Rebecka Apley, MD  benzonatate (TESSALON PERLES) 100 MG capsule Take 1 capsule (100 mg total) by mouth every 6 (six) hours as needed for cough. 08/05/15   Rebecka Apley, MD  cephALEXin (KEFLEX) 500 MG capsule Take 1 capsule (500 mg total) by mouth 2 (two) times daily. 02/24/17   Jene Every, MD  ondansetron (ZOFRAN ODT) 4 MG disintegrating tablet Take 1 tablet (4 mg total) by mouth every 8 (eight) hours as needed for nausea or vomiting. 02/08/15   Tommi Rumps, PA-C  oxyCODONE-acetaminophen (PERCOCET) 5-325 MG tablet Take 1 tablet by mouth every 4 (four) hours as needed for severe pain. 02/23/17   Darci Current, MD  predniSONE (DELTASONE) 20 MG tablet Take 3 tablets (60 mg total) by mouth daily. 08/05/15   Rebecka Apley, MD  ranitidine (ZANTAC) 150 MG tablet Take 1 tablet (150 mg total) by  mouth 2 (two) times daily. 02/08/15 02/08/16  Tommi Rumps, PA-C  sulfamethoxazole-trimethoprim (BACTRIM DS,SEPTRA DS) 800-160 MG tablet Take 1 tablet by mouth 2 (two) times daily for 10 days. 02/23/17 03/05/17  Darci Current, MD     Allergies Patient has no known allergies.  No family history on file.  Social History Social History   Tobacco Use  . Smoking status: Current Every Day Smoker    Packs/day: 0.50  . Smokeless tobacco: Never Used  Substance Use Topics  . Alcohol use: Yes  . Drug use: Not on file    Review of Systems  Constitutional: No fever/chills Eyes: No visual changes.  ENT: No sore throat. Cardiovascular: No palpitations Respiratory: No cough Gastrointestinal: No nausea, no vomiting.   Genitourinary: Negative for dysuria. Musculoskeletal: Negative for back pain. Skin: as Above Neurological: Negative for headaches    ____________________________________________   PHYSICAL EXAM:  VITAL SIGNS: ED Triage Vitals  Enc Vitals Group     BP 02/24/17 0942 129/60     Pulse Rate 02/24/17 0942 98     Resp 02/24/17 0942 20     Temp 02/24/17 0942 98.1 F (36.7 C)     Temp Source 02/24/17 0942 Oral     SpO2 02/24/17 0942 98 %     Weight 02/24/17 0943 136.1 kg (300 lb)     Height 02/24/17 0943 1.6 m (5\' 3" )     Head Circumference --  Peak Flow --      Pain Score 02/24/17 0940 7     Pain Loc --      Pain Edu? --      Excl. in GC? --     Constitutional: Alert and oriented. No acute distress. Pleasant and interactive  Nose: No congestion/rhinnorhea. Mouth/Throat: Mucous membranes are moist.   Neck:  Painless ROM Cardiovascular: Normal rate, regular rhythm.   Good peripheral circulation. Respiratory: Normal respiratory effort.  No retractions. Gastrointestinal: Soft and nontender. No distention  Musculoskeletal: Area of erythema surrounding central skin break, likely initial insect bite which is extended upon the previously drawn lines.  Small  amount of purulent discharge draining, unable to express significant amounts of purulence, no fluctuance or suggestion of abscess.  Warm and well perfused distally.  No streaking up the extremity Neurologic:  Normal speech and language. No gross focal neurologic deficits are appreciated.  Skin:  Skin is warm, dry. as above Psychiatric: Mood and affect are normal. Speech and behavior are normal.  ____________________________________________   LABS (all labs ordered are listed, but only abnormal results are displayed)  Labs Reviewed  COMPREHENSIVE METABOLIC PANEL - Abnormal; Notable for the following components:      Result Value   Calcium 8.6 (*)    All other components within normal limits  CBC WITH DIFFERENTIAL/PLATELET - Abnormal; Notable for the following components:   WBC 14.4 (*)    Neutro Abs 11.3 (*)    All other components within normal limits   ____________________________________________  EKG  None ____________________________________________  RADIOLOGY  None ____________________________________________   PROCEDURES  Procedure(s) performed: No  Procedures   Critical Care performed: No ____________________________________________   INITIAL IMPRESSION / ASSESSMENT AND PLAN / ED COURSE  Pertinent labs & imaging results that were available during my care of the patient were reviewed by me and considered in my medical decision making (see chart for details).  Patient presents with cellulitis which is extended upon the initial outlined area, this may be result of insufficient treatment time but also could be result of antibiotic failure.  Overall she is well-appearing, afebrile, normal heart rate, no evidence of abscess.  Will treat with dose of IV antibiotics and add on Keflex with strict return precautions   ----------------------------------------- 3:20 PM on 02/24/2017 ----------------------------------------- Patient reports some mild itching left arm.  No hives. No throat involvement. Vancomycin mostly done. Unclear if allergic reaction will treat as if it is with IV benadryl. Patient not driving.  ----------------------------------------- 3:56 PM on 02/24/2017 -----------------------------------------  Complete resolution of symptoms, patient is anxious to leave because her husband is waiting for her.  Would like to forego any further observation she knows she can return anytime    ____________________________________________   FINAL CLINICAL IMPRESSION(S) / ED DIAGNOSES  Final diagnoses:  Cellulitis of left lower extremity        Note:  This document was prepared using Dragon voice recognition software and may include unintentional dictation errors.    Jene EveryKinner, Ah Bott, MD 02/24/17 1513    Jene EveryKinner, Shaymus Eveleth, MD 02/24/17 [phone]

## 2017-02-24 NOTE — ED Notes (Signed)
No continued itching or rash noted on pt.

## 2017-02-24 NOTE — ED Notes (Signed)
Pt had been started on antibiotics for red swollen bite to left lower leg but it has not  Gotten any better and redness has spread, along with pain when walking

## 2017-02-24 NOTE — ED Notes (Signed)
Pt was having itching around neck and arm, MD notified and antibiotic stopped, infusion was almost completed. IV benadryl given for itching. Lungs clear and equal bilat, resp even and nonlabored, no resp. Distress or difficulty breathing noted.

## 2017-02-24 NOTE — ED Triage Notes (Signed)
Bite to lower left leg, was seen here for same couple of days ago, started on antibiotics and pain meds. Redness and swelling outside of marked line from two days ago, was told to return if not any better.

## 2017-02-28 LAB — CULTURE, BLOOD (ROUTINE X 2)
Culture: NO GROWTH
Special Requests: ADEQUATE

## 2017-03-25 ENCOUNTER — Other Ambulatory Visit: Payer: Self-pay

## 2017-03-25 ENCOUNTER — Encounter: Payer: Self-pay | Admitting: Emergency Medicine

## 2017-03-25 DIAGNOSIS — J45909 Unspecified asthma, uncomplicated: Secondary | ICD-10-CM | POA: Insufficient documentation

## 2017-03-25 DIAGNOSIS — F17210 Nicotine dependence, cigarettes, uncomplicated: Secondary | ICD-10-CM | POA: Insufficient documentation

## 2017-03-25 DIAGNOSIS — I880 Nonspecific mesenteric lymphadenitis: Secondary | ICD-10-CM | POA: Insufficient documentation

## 2017-03-25 LAB — COMPREHENSIVE METABOLIC PANEL
ALBUMIN: 4 g/dL (ref 3.5–5.0)
ALK PHOS: 73 U/L (ref 38–126)
ALT: 28 U/L (ref 14–54)
AST: 20 U/L (ref 15–41)
Anion gap: 5 (ref 5–15)
BUN: 15 mg/dL (ref 6–20)
CHLORIDE: 104 mmol/L (ref 101–111)
CO2: 30 mmol/L (ref 22–32)
CREATININE: 0.84 mg/dL (ref 0.44–1.00)
Calcium: 8.5 mg/dL — ABNORMAL LOW (ref 8.9–10.3)
GFR calc non Af Amer: >60 mL/min (ref >60)
GLUCOSE: 98 mg/dL (ref 65–99)
Potassium: 3.8 mmol/L (ref 3.5–5.1)
SODIUM: 139 mmol/L (ref 135–145)
Total Bilirubin: 0.6 mg/dL (ref 0.3–1.2)
Total Protein: 7.2 g/dL (ref 6.5–8.1)

## 2017-03-25 LAB — URINALYSIS, COMPLETE (UACMP) WITH MICROSCOPIC
BACTERIA UA: NONE SEEN
Bilirubin Urine: NEGATIVE
Glucose, UA: NEGATIVE mg/dL
Hgb urine dipstick: NEGATIVE
Ketones, ur: NEGATIVE mg/dL
Leukocytes, UA: NEGATIVE
Nitrite: NEGATIVE
PROTEIN: NEGATIVE mg/dL
SPECIFIC GRAVITY, URINE: 1.026 (ref 1.005–1.030)
pH: 5 (ref 5.0–8.0)

## 2017-03-25 LAB — CBC
HCT: 39 % (ref 35.0–47.0)
Hemoglobin: 12.8 g/dL (ref 12.0–16.0)
MCH: 29.1 pg (ref 26.0–34.0)
MCHC: 32.7 g/dL (ref 32.0–36.0)
MCV: 89 fL (ref 80.0–100.0)
Platelets: 295 10*3/uL (ref 150–440)
RBC: 4.39 MIL/uL (ref 3.80–5.20)
RDW: 13.6 % (ref 11.5–14.5)
WBC: 12.9 10*3/uL — AB (ref 3.6–11.0)

## 2017-03-25 LAB — POCT PREGNANCY, URINE: Preg Test, Ur: NEGATIVE

## 2017-03-25 NOTE — ED Triage Notes (Signed)
Pt reports bilateral lower back pain for 2-3 weeks; pt says sometimes she doesn't void all day and then other times she voids 20 times; abd cramping after voiding; intermittent dysuria; pt says back pain is actually worse after eating; pt says she's had 2-3 syncopal episodes in the last few weeks, last time was yesterday; pt is here today because the pain is worse;

## 2017-03-26 ENCOUNTER — Emergency Department: Payer: Self-pay

## 2017-03-26 ENCOUNTER — Emergency Department
Admission: EM | Admit: 2017-03-26 | Discharge: 2017-03-26 | Disposition: A | Discharge status: Home / Self Care | Payer: Self-pay | Attending: Emergency Medicine | Admitting: Emergency Medicine

## 2017-03-26 DIAGNOSIS — M546 Pain in thoracic spine: Secondary | ICD-10-CM

## 2017-03-26 DIAGNOSIS — I880 Nonspecific mesenteric lymphadenitis: Secondary | ICD-10-CM

## 2017-03-26 MED ORDER — KETOROLAC TROMETHAMINE 30 MG/ML IJ SOLN
30.0000 mg | Freq: Once | INTRAMUSCULAR | Status: DC
  Filled 2017-03-26: qty 1

## 2017-03-26 MED ORDER — KETOROLAC TROMETHAMINE 30 MG/ML IJ SOLN
30.0000 mg | Freq: Once | INTRAMUSCULAR | Status: AC
  Administered 2017-03-26: 30 mg via INTRAMUSCULAR

## 2017-03-26 MED ORDER — LIDOCAINE 5 % EX PTCH: 1.0000 | MEDICATED_PATCH | Freq: Two times a day (BID) | CUTANEOUS | 0 refills | Status: DC

## 2017-03-26 MED ORDER — TRAMADOL HCL 50 MG PO TABS: 50.0000 mg | ORAL_TABLET | Freq: Four times a day (QID) | ORAL | 0 refills | Status: DC | PRN

## 2017-03-26 MED ORDER — DIAZEPAM 2 MG PO TABS: 2.0000 mg | ORAL_TABLET | Freq: Four times a day (QID) | ORAL | 0 refills | Status: DC | PRN

## 2017-03-26 MED ORDER — KETOROLAC TROMETHAMINE 30 MG/ML IJ SOLN
INTRAMUSCULAR | Status: AC
  Filled 2017-03-26: qty 1

## 2017-03-26 MED ORDER — SODIUM CHLORIDE 0.9 % IV BOLUS (SEPSIS): 1000.0000 mL | Freq: Once | INTRAVENOUS | Status: DC

## 2017-03-26 NOTE — ED Notes (Signed)
Pt updated on care plan. Pt verbalizes understanding.  

## 2017-03-26 NOTE — ED Provider Notes (Signed)
[facility] Emergency Department Provider Note   ____________________________________________   First MD Initiated Contact with Patient 03/26/17 0145     (approximate)  I have reviewed the triage vital signs and the nursing notes.   HISTORY  Chief Complaint Back Pain; Urinary Frequency; and Loss of Consciousness    HPI Emma Hamilton is a 32 y.o. female who comes into the hospital today with some back pain.  The patient reports that she thought it was due to side effects of some other medical problems that she had.  The patient tried taking Aleve and then states that it made the pain worse.  She reports that she stopped taking the Aleve but the pain never got any better.  The patient reports that she has waves of intense pain followed by some dull aches.  It is her bilateral back around her spine but not on her spine itself.  The patient states that she is concerned about kidney stones.  She rates her pain a 5 out of 10 in intensity.  The patient has been drinking apple juice and cranberry juice.  She reports it has not been getting any better.  She reports she has some mild discomfort with urination and some abdominal cramps after she urinates but otherwise nothing else.  The patient is here today for evaluation.  Past Medical History:  Diagnosis Date  . Asthma     There are no active problems to display for this patient.   Past Surgical History:  Procedure Laterality Date  . CESAREAN SECTION      Prior to Admission medications   Medication Sig Start Date End Date Taking? Authorizing Provider  albuterol (PROVENTIL HFA;VENTOLIN HFA) 108 (90 Base) MCG/ACT inhaler Inhale 2 puffs into the lungs every 6 (six) hours as needed. 08/05/15   Rebecka Apley, MD  benzonatate (TESSALON PERLES) 100 MG capsule Take 1 capsule (100 mg total) by mouth every 6 (six) hours as needed for cough. 08/05/15   Rebecka Apley, MD  cephALEXin (KEFLEX) 500 MG capsule  Take 1 capsule (500 mg total) by mouth 2 (two) times daily. 02/24/17   Jene Every, MD  diazepam (VALIUM) 2 MG tablet Take 1 tablet (2 mg total) by mouth every 6 (six) hours as needed for anxiety. 03/26/17   Rebecka Apley, MD  lidocaine (LIDODERM) 5 % Place 1 patch onto the skin every 12 (twelve) hours. Remove & Discard patch within 12 hours or as directed by MD 03/26/17 03/26/18  Rebecka Apley, MD  ondansetron (ZOFRAN ODT) 4 MG disintegrating tablet Take 1 tablet (4 mg total) by mouth every 8 (eight) hours as needed for nausea or vomiting. 02/08/15   Tommi Rumps, PA-C  oxyCODONE-acetaminophen (PERCOCET) 5-325 MG tablet Take 1 tablet by mouth every 4 (four) hours as needed for severe pain. 02/23/17   Darci Current, MD  predniSONE (DELTASONE) 20 MG tablet Take 3 tablets (60 mg total) by mouth daily. 08/05/15   Rebecka Apley, MD  ranitidine (ZANTAC) 150 MG tablet Take 1 tablet (150 mg total) by mouth 2 (two) times daily. 02/08/15 02/08/16  Tommi Rumps, PA-C  traMADol (ULTRAM) 50 MG tablet Take 1 tablet (50 mg total) by mouth every 6 (six) hours as needed. 03/26/17   Rebecka Apley, MD    Allergies Patient has no known allergies.  History reviewed. No pertinent family history.  Social History Social History   Tobacco Use  . Smoking status: Current Every  Day Smoker    Packs/day: 0.50    Types: Cigarettes  . Smokeless tobacco: Never Used  Substance Use Topics  . Alcohol use: Yes  . Drug use: No    Review of Systems  Constitutional: No fever/chills Eyes: No visual changes. ENT: No sore throat. Cardiovascular: Denies chest pain. Respiratory: Denies shortness of breath. Gastrointestinal: No abdominal pain.  No nausea, no vomiting.  No diarrhea.  No constipation. Genitourinary: Negative for dysuria. Musculoskeletal:  back pain. Skin: Negative for rash. Neurological: Negative for headaches, focal weakness or  numbness.   ____________________________________________   PHYSICAL EXAM:  VITAL SIGNS: ED Triage Vitals  Enc Vitals Group     BP 03/25/17 2127 (!) 162/81     Pulse Rate 03/25/17 2127 (!) 106     Resp 03/25/17 2127 20     Temp 03/25/17 2127 98.4 F (36.9 C)     Temp Source 03/25/17 2127 Oral     SpO2 03/25/17 2127 100 %     Weight 03/25/17 2128 300 lb (136.1 kg)     Height 03/25/17 2128 5\' 3"  (1.6 m)     Head Circumference --      Peak Flow --      Pain Score 03/25/17 2130 4     Pain Loc --      Pain Edu? --      Excl. in GC? --     Constitutional: Alert and oriented. Well appearing and in no acute distress. Eyes: Conjunctivae are normal. PERRL. EOMI. Head: Atraumatic. Nose: No congestion/rhinnorhea. Mouth/Throat: Mucous membranes are moist.  Oropharynx non-erythematous. Cardiovascular: Normal rate, regular rhythm. Grossly normal heart sounds.  Good peripheral circulation. Respiratory: Normal respiratory effort.  No retractions. Lungs CTAB. Gastrointestinal: Soft and nontender. No distention.  Positive bowel sounds Musculoskeletal: No lower extremity tenderness nor edema.  Tenderness to palpation of thoracic paraspinous muscles Neurologic:  Normal speech and language.  Skin:  Skin is warm, dry and intact.  Psychiatric: Mood and affect are normal.   ____________________________________________   LABS (all labs ordered are listed, but only abnormal results are displayed)  Labs Reviewed  CBC - Abnormal; Notable for the following components:      Result Value   WBC 12.9 (*)    All other components within normal limits  URINALYSIS, COMPLETE (UACMP) WITH MICROSCOPIC - Abnormal; Notable for the following components:   Color, Urine YELLOW (*)    APPearance HAZY (*)    Squamous Epithelial / LPF 6-30 (*)    All other components within normal limits  COMPREHENSIVE METABOLIC PANEL - Abnormal; Notable for the following components:   Calcium 8.5 (*)    All other components  within normal limits  POC URINE PREG, ED  POCT PREGNANCY, URINE   ____________________________________________  EKG  ED ECG REPORT I, Rebecka ApleyWebster,  Maloni Musleh P, the attending physician, personally viewed and interpreted this ECG.   Date: 03/25/2017  EKG Time: 2207  Rate: 91  Rhythm: normal sinus rhythm  Axis: normal  Intervals:none  ST&T Change: none  ____________________________________________  RADIOLOGY  ED MD interpretation:  CT renal stone study: Soft tissue inflammation at the small bowel mesentery with associated prominent mesenteric nodes could reflect underlying infectious or inflammatory process, otherwise unremarkable.  Official radiology report(s): Ct Renal Stone Study  Result Date: 03/26/2017 CLINICAL DATA:  Subacute onset of bilateral lower back pain. Abdominal cramping and dysuria. Syncope. EXAM: CT ABDOMEN AND PELVIS WITHOUT CONTRAST TECHNIQUE: Multidetector CT imaging of the abdomen and pelvis was performed following the  standard protocol without IV contrast. COMPARISON:  CT of the abdomen and pelvis performed 12/30/2009, and pelvic ultrasound performed 02/07/2013 FINDINGS: Lower chest: The visualized lung bases are grossly clear. The visualized portions of the mediastinum are unremarkable. Hepatobiliary: The liver is unremarkable in appearance. The gallbladder is unremarkable in appearance. The common bile duct remains normal in caliber. Pancreas: The pancreas is within normal limits. Spleen: The spleen is unremarkable in appearance. Adrenals/Urinary Tract: The adrenal glands are unremarkable in appearance. The kidneys are within normal limits. There is no evidence of hydronephrosis. No renal or ureteral stones are identified. No perinephric stranding is seen. Stomach/Bowel: The stomach is unremarkable in appearance. The small bowel is within normal limits. The appendix is normal in caliber, without evidence of appendicitis. The colon is unremarkable in appearance.  Vascular/Lymphatic: The abdominal aorta is unremarkable in appearance. The inferior vena cava is grossly unremarkable. No retroperitoneal lymphadenopathy is seen. No pelvic sidewall lymphadenopathy is identified. Reproductive: The bladder is mildly distended and grossly unremarkable. The uterus is unremarkable in appearance. The ovaries are relatively symmetric. No suspicious adnexal masses are seen. Other: Soft tissue inflammation is noted at the small bowel mesentery, with associated prominent mesenteric nodes. This could reflect an underlying infectious or inflammatory process. Musculoskeletal: No acute osseous abnormalities are identified. The visualized musculature is unremarkable in appearance. IMPRESSION: 1. Soft tissue inflammation at the small bowel mesentery, with associated prominent mesenteric nodes. This could reflect an underlying infectious or inflammatory process. 2. Otherwise unremarkable noncontrast CT of the abdomen and pelvis. Electronically Signed   By: Roanna Raider M.D.   On: 03/26/2017 05:14    ____________________________________________   PROCEDURES  Procedure(s) performed: None  Procedures  Critical Care performed: No  ____________________________________________   INITIAL IMPRESSION / ASSESSMENT AND PLAN / ED COURSE  As part of my medical decision making, I reviewed the following data within the electronic MEDICAL RECORD NUMBER Notes from prior ED visits and [place] Controlled Substance Database   This is a 32 year old female who comes into the hospital today with some back pain.  Differential diagnosis includes UTI, kidney stone, musculoskeletal pain.  The patient did receive some blood work which was unremarkable and a CT which also was unremarkable aside from mesenteric adenitis.  Patient did receive a dose of Toradol which did help her pain.  Patient's urinalysis does not show any signs of infection nor any blood.  The patient will be discharged home and encouraged  to follow-up with her primary care physician for further evaluation of her back pain.      ____________________________________________   FINAL CLINICAL IMPRESSION(S) / ED DIAGNOSES  Final diagnoses:  Acute bilateral thoracic back pain  Mesenteric adenitis     ED Discharge Orders        Ordered    traMADol (ULTRAM) 50 MG tablet  Every 6 hours PRN     03/26/17 0538    lidocaine (LIDODERM) 5 %  Every 12 hours     03/26/17 0538    diazepam (VALIUM) 2 MG tablet  Every 6 hours PRN     03/26/17 0538       Note:  This document was prepared using Dragon voice recognition software and may include unintentional dictation errors.    Rebecka Apley, MD 03/26/17 [phone]

## 2017-03-26 NOTE — Discharge Instructions (Signed)
Please follow-up with your primary care physician for further evaluation of your back pain. °

## 2017-03-26 NOTE — ED Notes (Signed)
Attempt iv initiation x1 without success. Onalee Huaavid, rn to attempt ultrasound guided iv, pt advised of process.

## 2017-03-26 NOTE — ED Notes (Signed)
Pt resting quietly in WR, head resting on husband's shoulder and eyes closed; waiting patiently for treatment room

## 2017-03-26 NOTE — ED Notes (Signed)
Pt states that a few weeks ago she began to feel nauseous and had upper back pains around her kidney area. She also states that she took some ADVIL and Naproxen for some shoulder pain and it made her back pain even worse, so she stopped taking it. Friend at bedside.

## 2017-03-26 NOTE — ED Notes (Signed)
Pt with ultrasound guided iv attempt x3 by david, rn without success. md notified, order to discontinue ivf received and order to change toradol to im received.

## 2017-03-26 NOTE — ED Notes (Addendum)
Unsuccessful USGIV attempt x3 (RAC, RFA, LAC), patient preference for third attempt.

## 2018-02-23 ENCOUNTER — Encounter: Payer: Self-pay | Admitting: Emergency Medicine

## 2018-02-23 ENCOUNTER — Emergency Department
Admission: EM | Admit: 2018-02-23 | Discharge: 2018-02-23 | Disposition: A | Discharge status: Home / Self Care | Payer: Self-pay | Attending: Emergency Medicine | Admitting: Emergency Medicine

## 2018-02-23 ENCOUNTER — Emergency Department: Payer: Self-pay

## 2018-02-23 DIAGNOSIS — J45909 Unspecified asthma, uncomplicated: Secondary | ICD-10-CM | POA: Insufficient documentation

## 2018-02-23 DIAGNOSIS — F17210 Nicotine dependence, cigarettes, uncomplicated: Secondary | ICD-10-CM | POA: Insufficient documentation

## 2018-02-23 DIAGNOSIS — M25562 Pain in left knee: Secondary | ICD-10-CM | POA: Insufficient documentation

## 2018-02-23 DIAGNOSIS — W19XXXA Unspecified fall, initial encounter: Secondary | ICD-10-CM | POA: Insufficient documentation

## 2018-02-23 MED ORDER — MELOXICAM 15 MG PO TABS: 15.0000 mg | ORAL_TABLET | Freq: Every day | ORAL | 2 refills | Status: DC

## 2018-02-23 MED ORDER — HYDROCODONE-ACETAMINOPHEN 5-325 MG PO TABS
1.0000 | ORAL_TABLET | Freq: Once | ORAL | Status: AC
Start: 2018-02-23 — End: 2018-02-23
  Administered 2018-02-23: 1 via ORAL
  Filled 2018-02-23: qty 1

## 2018-02-23 NOTE — ED Triage Notes (Signed)
Pt reports she fell Friday but her left knee didn't started hurting until Monday. Pt states the pain is getting worse and now she can't put weight on it.

## 2018-02-23 NOTE — Discharge Instructions (Signed)
Follow-up with your regular doctor or follow-up with Dr. Rexanne Mano.  Please call for an appointment.  Use medication as prescribed.  Return if worsening.  Apply ice to the knee.

## 2018-02-23 NOTE — ED Provider Notes (Signed)
[facility] Emergency Department Provider Note  ____________________________________________   First MD Initiated Contact with Patient 02/23/18 1828     (approximate)  I have reviewed the triage vital signs and the nursing notes.   HISTORY  Chief Complaint Knee Injury and Knee Pain    HPI Shacarra Millana Stahl is a 33 y.o. female presents emergency department complaining of pain to the left knee.  She states that she had some swelling and pain at the center of the knee.  She states that this knee did not hurt after the fall it started hurting later.  She denies any numbness or tingling.  Denies any other injuries or pain at this time    Past Medical History:  Diagnosis Date  . Asthma     There are no active problems to display for this patient.   Past Surgical History:  Procedure Laterality Date  . CESAREAN SECTION      Prior to Admission medications   Medication Sig Start Date End Date Taking? Authorizing Provider  albuterol (PROVENTIL HFA;VENTOLIN HFA) 108 (90 Base) MCG/ACT inhaler Inhale 2 puffs into the lungs every 6 (six) hours as needed. 08/05/15   Rebecka Apley, MD  meloxicam (MOBIC) 15 MG tablet Take 1 tablet (15 mg total) by mouth daily. 02/23/18 02/23/19  Faythe Ghee, PA-C    Allergies Patient has no known allergies.  No family history on file.  Social History Social History   Tobacco Use  . Smoking status: Current Every Day Smoker    Packs/day: 0.50    Types: Cigarettes  . Smokeless tobacco: Never Used  Substance Use Topics  . Alcohol use: Yes  . Drug use: No    Review of Systems  Constitutional: No fever/chills Eyes: No visual changes. ENT: No sore throat. Respiratory: Denies cough Genitourinary: Negative for dysuria. Musculoskeletal: Negative for back pain.  Positive for knee pain Skin: Negative for rash.    ____________________________________________   PHYSICAL EXAM:  VITAL SIGNS: ED Triage Vitals    Enc Vitals Group     BP 02/23/18 1704 (!) 154/90     Pulse Rate 02/23/18 1701 100     Resp 02/23/18 1701 20     Temp 02/23/18 1912 97.8 F (36.6 C)     Temp Source 02/23/18 1912 Oral     SpO2 02/23/18 1701 97 %     Weight 02/23/18 1702 300 lb (136.1 kg)     Height 02/23/18 1702 5\' 3"  (1.6 m)     Head Circumference --      Peak Flow --      Pain Score 02/23/18 1701 2     Pain Loc --      Pain Edu? --      Excl. in GC? --     Constitutional: Alert and oriented. Well appearing and in no acute distress. Eyes: Conjunctivae are normal.  Head: Atraumatic. Nose: No congestion/rhinnorhea. Mouth/Throat: Mucous membranes are moist.   Neck:  supple no lymphadenopathy noted Cardiovascular: Normal rate, regular rhythm.  Respiratory: Normal respiratory effort.  No retractions,  GU: deferred Musculoskeletal: Left knee is tender at the joint line and anteriorly close to the patella tendon, pain is reproduced with extension.  Ligaments appear to be intact but patient is guarding.  Neurovascular is intact.   Neurologic:  Normal speech and language.  Skin:  Skin is warm, dry and intact. No rash noted. Psychiatric: Mood and affect are normal. Speech and behavior are normal.  ____________________________________________  LABS (all labs ordered are listed, but only abnormal results are displayed)  Labs Reviewed - No data to display ____________________________________________   ____________________________________________  RADIOLOGY  X-ray of the left knee is negative    ____________________________________________   PROCEDURES  Procedure(s) performed: 2 OCL's were applied on each side of the knee and an Ace wrap was applied around these as patient's knee is too large for a standard knee immobilizer  Procedures    ____________________________________________   INITIAL IMPRESSION / ASSESSMENT AND PLAN / ED COURSE  Pertinent labs & imaging results that were available during  my care of the patient were reviewed by me and considered in my medical decision making (see chart for details).   Patient is a 33 year old female presents emergency department complaining of left knee pain.  Physical exam shows a very large left knee.  The anterior part of the knee is tender to palpation.  X-ray left knee is negative  Knee immobilizer type splint was applied.   Patient was given a Vicodin prior to splint application.  She was given a prescription for meloxicam.  She is to follow-up with orthopedics.  She is to call make an appointment.  Apply ice to the knee.  Return for worsening.  States she understands will comply.  She discharged stable condition.  As part of my medical decision making, I reviewed the following data within the electronic MEDICAL RECORD NUMBER Nursing notes reviewed and incorporated, Old chart reviewed, Radiograph reviewed x-ray of the left knee is negative, Notes from prior ED visits and [place] Controlled Substance Database  ____________________________________________   FINAL CLINICAL IMPRESSION(S) / ED DIAGNOSES  Final diagnoses:  Acute pain of left knee      NEW MEDICATIONS STARTED DURING THIS VISIT:  New Prescriptions   MELOXICAM (MOBIC) 15 MG TABLET    Take 1 tablet (15 mg total) by mouth daily.     Note:  This document was prepared using Dragon voice recognition software and may include unintentional dictation errors.    Faythe Ghee, PA-C 02/23/18 1930    Pershing Proud Myra Rude, MD 02/23/18 [phone]

## 2018-02-23 NOTE — ED Notes (Signed)
See triage note   States she fell last Friday  Developed pain to left knee on Monday  States she has had swelling and pain to center on knee

## 2018-02-23 NOTE — ED Notes (Signed)
Pt given walker per provider request

## 2018-02-28 ENCOUNTER — Ambulatory Visit: Payer: Self-pay

## 2018-03-08 ENCOUNTER — Other Ambulatory Visit: Payer: Self-pay

## 2018-03-08 ENCOUNTER — Encounter: Payer: Self-pay | Admitting: Gerontology

## 2018-03-08 ENCOUNTER — Ambulatory Visit: Payer: Self-pay | Admitting: Gerontology

## 2018-03-08 VITALS — BP 135/89 | HR 87 | Ht 63.0 in | Wt 300.0 lb

## 2018-03-08 DIAGNOSIS — Z7689 Persons encountering health services in other specified circumstances: Secondary | ICD-10-CM

## 2018-03-08 DIAGNOSIS — S8992XA Unspecified injury of left lower leg, initial encounter: Secondary | ICD-10-CM

## 2018-03-08 DIAGNOSIS — Z8709 Personal history of other diseases of the respiratory system: Secondary | ICD-10-CM

## 2018-03-08 NOTE — Patient Instructions (Signed)
Calorie Counting for Weight Loss Calories are units of energy. Your body needs a certain amount of calories from food to keep you going throughout the day. When you eat more calories than your body needs, your body stores the extra calories as fat. When you eat fewer calories than your body needs, your body burns fat to get the energy it needs. Calorie counting means keeping track of how many calories you eat and drink each day. Calorie counting can be helpful if you need to lose weight. If you make sure to eat fewer calories than your body needs, you should lose weight. Ask your health care provider what a healthy weight is for you. For calorie counting to work, you will need to eat the right number of calories in a day in order to lose a healthy amount of weight per week. A dietitian can help you determine how many calories you need in a day and will give you suggestions on how to reach your calorie goal.  A healthy amount of weight to lose per week is usually 1-2 lb (0.5-0.9 kg). This usually means that your daily calorie intake should be reduced by 500-750 calories.  Eating 1,200 - 1,500 calories per day can help most women lose weight.  Eating 1,500 - 1,800 calories per day can help most men lose weight. What is my plan? My goal is to have __________ calories per day. If I have this many calories per day, I should lose around __________ pounds per week. What do I need to know about calorie counting? In order to meet your daily calorie goal, you will need to:  Find out how many calories are in each food you would like to eat. Try to do this before you eat.  Decide how much of the food you plan to eat.  Write down what you ate and how many calories it had. Doing this is called keeping a food log. To successfully lose weight, it is important to balance calorie counting with a healthy lifestyle that includes regular activity. Aim for 150 minutes of moderate exercise (such as walking) or 75  minutes of vigorous exercise (such as running) each week. Where do I find calorie information?  The number of calories in a food can be found on a Nutrition Facts label. If a food does not have a Nutrition Facts label, try to look up the calories online or ask your dietitian for help. Remember that calories are listed per serving. If you choose to have more than one serving of a food, you will have to multiply the calories per serving by the amount of servings you plan to eat. For example, the label on a package of bread might say that a serving size is 1 slice and that there are 90 calories in a serving. If you eat 1 slice, you will have eaten 90 calories. If you eat 2 slices, you will have eaten 180 calories. How do I keep a food log? Immediately after each meal, record the following information in your food log:  What you ate. Don't forget to include toppings, sauces, and other extras on the food.  How much you ate. This can be measured in cups, ounces, or number of items.  How many calories each food and drink had.  The total number of calories in the meal. Keep your food log near you, such as in a small notebook in your pocket, or use a mobile app or website. Some programs will calculate   calories for you and show you how many calories you have left for the day to meet your goal. What are some calorie counting tips?   Use your calories on foods and drinks that will fill you up and not leave you hungry: ? Some examples of foods that fill you up are nuts and nut butters, vegetables, lean proteins, and high-fiber foods like whole grains. High-fiber foods are foods with more than 5 g fiber per serving. ? Drinks such as sodas, specialty coffee drinks, alcohol, and juices have a lot of calories, yet do not fill you up.  Eat nutritious foods and avoid empty calories. Empty calories are calories you get from foods or beverages that do not have many vitamins or protein, such as candy, sweets, and  soda. It is better to have a nutritious high-calorie food (such as an avocado) than a food with few nutrients (such as a bag of chips).  Know how many calories are in the foods you eat most often. This will help you calculate calorie counts faster.  Pay attention to calories in drinks. Low-calorie drinks include water and unsweetened drinks.  Pay attention to nutrition labels for "low fat" or "fat free" foods. These foods sometimes have the same amount of calories or more calories than the full fat versions. They also often have added sugar, starch, or salt, to make up for flavor that was removed with the fat.  Find a way of tracking calories that works for you. Get creative. Try different apps or programs if writing down calories does not work for you. What are some portion control tips?  Know how many calories are in a serving. This will help you know how many servings of a certain food you can have.  Use a measuring cup to measure serving sizes. You could also try weighing out portions on a kitchen scale. With time, you will be able to estimate serving sizes for some foods.  Take some time to put servings of different foods on your favorite plates, bowls, and cups so you know what a serving looks like.  Try not to eat straight from a bag or box. Doing this can lead to overeating. Put the amount you would like to eat in a cup or on a plate to make sure you are eating the right portion.  Use smaller plates, glasses, and bowls to prevent overeating.  Try not to multitask (for example, watch TV or use your computer) while eating. If it is time to eat, sit down at a table and enjoy your food. This will help you to know when you are full. It will also help you to be aware of what you are eating and how much you are eating. What are tips for following this plan? Reading food labels  Check the calorie count compared to the serving size. The serving size may be smaller than what you are used to  eating.  Check the source of the calories. Make sure the food you are eating is high in vitamins and protein and low in saturated and trans fats. Shopping  Read nutrition labels while you shop. This will help you make healthy decisions before you decide to purchase your food.  Make a grocery list and stick to it. Cooking  Try to cook your favorite foods in a healthier way. For example, try baking instead of frying.  Use low-fat dairy products. Meal planning  Use more fruits and vegetables. Half of your plate should be fruits   and vegetables.  Include lean proteins like poultry and fish. How do I count calories when eating out?  Ask for smaller portion sizes.  Consider sharing an entree and sides instead of getting your own entree.  If you get your own entree, eat only half. Ask for a box at the beginning of your meal and put the rest of your entree in it so you are not tempted to eat it.  If calories are listed on the menu, choose the lower calorie options.  Choose dishes that include vegetables, fruits, whole grains, low-fat dairy products, and lean protein.  Choose items that are boiled, broiled, grilled, or steamed. Stay away from items that are buttered, battered, fried, or served with cream sauce. Items labeled "crispy" are usually fried, unless stated otherwise.  Choose water, low-fat milk, unsweetened iced tea, or other drinks without added sugar. If you want an alcoholic beverage, choose a lower calorie option such as a glass of wine or light beer.  Ask for dressings, sauces, and syrups on the side. These are usually high in calories, so you should limit the amount you eat.  If you want a salad, choose a garden salad and ask for grilled meats. Avoid extra toppings like bacon, cheese, or fried items. Ask for the dressing on the side, or ask for olive oil and vinegar or lemon to use as dressing.  Estimate how many servings of a food you are given. For example, a serving of  cooked rice is  cup or about the size of half a baseball. Knowing serving sizes will help you be aware of how much food you are eating at restaurants. The list below tells you how big or small some common portion sizes are based on everyday objects: ? 1 oz-4 stacked dice. ? 3 oz-1 deck of cards. ? 1 tsp-1 die. ? 1 Tbsp- a ping-pong ball. ? 2 Tbsp-1 ping-pong ball. ?  cup- baseball. ? 1 cup-1 baseball. Summary  Calorie counting means keeping track of how many calories you eat and drink each day. If you eat fewer calories than your body needs, you should lose weight.  A healthy amount of weight to lose per week is usually 1-2 lb (0.5-0.9 kg). This usually means reducing your daily calorie intake by 500-750 calories.  The number of calories in a food can be found on a Nutrition Facts label. If a food does not have a Nutrition Facts label, try to look up the calories online or ask your dietitian for help.  Use your calories on foods and drinks that will fill you up, and not on foods and drinks that will leave you hungry.  Use smaller plates, glasses, and bowls to prevent overeating. This information is not intended to replace advice given to you by your health care provider. Make sure you discuss any questions you have with your health care provider. Document Released: 01/03/2005 Document Revised: 09/22/2017 Document Reviewed: 12/04/2015 Elsevier Interactive Patient Education  2019 [employer] DASH Eating Plan DASH stands for "Dietary Approaches to Stop Hypertension." The DASH eating plan is a healthy eating plan that has been shown to reduce high blood pressure (hypertension). It may also reduce your risk for type 2 diabetes, heart disease, and stroke. The DASH eating plan may also help with weight loss. What are tips for following this plan?  General guidelines  Avoid eating more than 2,300 mg (milligrams) of salt (sodium) a day. If you have hypertension, you may need to reduce your    sodium intake to 1,500 mg a day.  Limit alcohol intake to no more than 1 drink a day for nonpregnant women and 2 drinks a day for men. One drink equals 12 oz of beer, 5 oz of wine, or 1 oz of hard liquor.  Work with your health care provider to maintain a healthy body weight or to lose weight. Ask what an ideal weight is for you.  Get at least 30 minutes of exercise that causes your heart to beat faster (aerobic exercise) most days of the week. Activities may include walking, swimming, or biking.  Work with your health care provider or diet and nutrition specialist (dietitian) to adjust your eating plan to your individual calorie needs. Reading food labels   Check food labels for the amount of sodium per serving. Choose foods with less than 5 percent of the Daily Value of sodium. Generally, foods with less than 300 mg of sodium per serving fit into this eating plan.  To find whole grains, look for the word "whole" as the first word in the ingredient list. Shopping  Buy products labeled as "low-sodium" or "no salt added."  Buy fresh foods. Avoid canned foods and premade or frozen meals. Cooking  Avoid adding salt when cooking. Use salt-free seasonings or herbs instead of table salt or sea salt. Check with your health care provider or pharmacist before using salt substitutes.  Do not fry foods. Cook foods using healthy methods such as baking, boiling, grilling, and broiling instead.  Cook with heart-healthy oils, such as olive, canola, soybean, or sunflower oil. Meal planning  Eat a balanced diet that includes: ? 5 or more servings of fruits and vegetables each day. At each meal, try to fill half of your plate with fruits and vegetables. ? Up to 6-8 servings of whole grains each day. ? Less than 6 oz of lean meat, poultry, or fish each day. A 3-oz serving of meat is about the same size as a deck of cards. One egg equals 1 oz. ? 2 servings of low-fat dairy each day. ? A serving of  nuts, seeds, or beans 5 times each week. ? Heart-healthy fats. Healthy fats called Omega-3 fatty acids are found in foods such as flaxseeds and coldwater fish, like sardines, salmon, and mackerel.  Limit how much you eat of the following: ? Canned or prepackaged foods. ? Food that is high in trans fat, such as fried foods. ? Food that is high in saturated fat, such as fatty meat. ? Sweets, desserts, sugary drinks, and other foods with added sugar. ? Full-fat dairy products.  Do not salt foods before eating.  Try to eat at least 2 vegetarian meals each week.  Eat more home-cooked food and less restaurant, buffet, and fast food.  When eating at a restaurant, ask that your food be prepared with less salt or no salt, if possible. What foods are recommended? The items listed may not be a complete list. Talk with your dietitian about what dietary choices are best for you. Grains Whole-grain or whole-wheat bread. Whole-grain or whole-wheat pasta. Brown rice. Oatmeal. Quinoa. Bulgur. Whole-grain and low-sodium cereals. Pita bread. Low-fat, low-sodium crackers. Whole-wheat flour tortillas. Vegetables Fresh or frozen vegetables (raw, steamed, roasted, or grilled). Low-sodium or reduced-sodium tomato and vegetable juice. Low-sodium or reduced-sodium tomato sauce and tomato paste. Low-sodium or reduced-sodium canned vegetables. Fruits All fresh, dried, or frozen fruit. Canned fruit in natural juice (without added sugar). Meat and other protein foods Skinless chicken or turkey.   Ground chicken or turkey. Pork with fat trimmed off. Fish and seafood. Egg whites. Dried beans, peas, or lentils. Unsalted nuts, nut butters, and seeds. Unsalted canned beans. Lean cuts of beef with fat trimmed off. Low-sodium, lean deli meat. Dairy Low-fat (1%) or fat-free (skim) milk. Fat-free, low-fat, or reduced-fat cheeses. Nonfat, low-sodium ricotta or cottage cheese. Low-fat or nonfat yogurt. Low-fat, low-sodium  cheese. Fats and oils Soft margarine without trans fats. Vegetable oil. Low-fat, reduced-fat, or light mayonnaise and salad dressings (reduced-sodium). Canola, safflower, olive, soybean, and sunflower oils. Avocado. Seasoning and other foods Herbs. Spices. Seasoning mixes without salt. Unsalted popcorn and pretzels. Fat-free sweets. What foods are not recommended? The items listed may not be a complete list. Talk with your dietitian about what dietary choices are best for you. Grains Baked goods made with fat, such as croissants, muffins, or some breads. Dry pasta or rice meal packs. Vegetables Creamed or fried vegetables. Vegetables in a cheese sauce. Regular canned vegetables (not low-sodium or reduced-sodium). Regular canned tomato sauce and paste (not low-sodium or reduced-sodium). Regular tomato and vegetable juice (not low-sodium or reduced-sodium). Pickles. Olives. Fruits Canned fruit in a light or heavy syrup. Fried fruit. Fruit in cream or butter sauce. Meat and other protein foods Fatty cuts of meat. Ribs. Fried meat. Bacon. Sausage. Bologna and other processed lunch meats. Salami. Fatback. Hotdogs. Bratwurst. Salted nuts and seeds. Canned beans with added salt. Canned or smoked fish. Whole eggs or egg yolks. Chicken or turkey with skin. Dairy Whole or 2% milk, cream, and half-and-half. Whole or full-fat cream cheese. Whole-fat or sweetened yogurt. Full-fat cheese. Nondairy creamers. Whipped toppings. Processed cheese and cheese spreads. Fats and oils Butter. Stick margarine. Lard. Shortening. Ghee. Bacon fat. Tropical oils, such as coconut, palm kernel, or palm oil. Seasoning and other foods Salted popcorn and pretzels. Onion salt, garlic salt, seasoned salt, table salt, and sea salt. Worcestershire sauce. Tartar sauce. Barbecue sauce. Teriyaki sauce. Soy sauce, including reduced-sodium. Steak sauce. Canned and packaged gravies. Fish sauce. Oyster sauce. Cocktail sauce. Horseradish  that you find on the shelf. Ketchup. Mustard. Meat flavorings and tenderizers. Bouillon cubes. Hot sauce and Tabasco sauce. Premade or packaged marinades. Premade or packaged taco seasonings. Relishes. Regular salad dressings. Where to find more information:  National Heart, Lung, and Blood Institute: www.nhlbi.nih.gov  American Heart Association: www.heart.org Summary  The DASH eating plan is a healthy eating plan that has been shown to reduce high blood pressure (hypertension). It may also reduce your risk for type 2 diabetes, heart disease, and stroke.  With the DASH eating plan, you should limit salt (sodium) intake to 2,300 mg a day. If you have hypertension, you may need to reduce your sodium intake to 1,500 mg a day.  When on the DASH eating plan, aim to eat more fresh fruits and vegetables, whole grains, lean proteins, low-fat dairy, and heart-healthy fats.  Work with your health care provider or diet and nutrition specialist (dietitian) to adjust your eating plan to your individual calorie needs. This information is not intended to replace advice given to you by your health care provider. Make sure you discuss any questions you have with your health care provider. Document Released: 12/23/2010 Document Revised: 12/28/2015 Document Reviewed: 12/28/2015 Elsevier Interactive Patient Education  2019 [employer] Inc[employer]  

## 2018-03-08 NOTE — Progress Notes (Signed)
Patient ID: Emma Hamilton, female   DOB: 15-Apr-1985, 33 y.o.   MRN: [id number]  Chief Complaint  Patient presents with  . New Patient (Initial Visit)    torn meniscus; was seen 1.5 weeks ago in ER    HPI Emma Hamilton is a 33 y.o. female states fell 2 1/2 weeks ago, had swelling to her left knee and went to the ED 1 week and 1/2 ago., X ray done and per patient, she stated that she had a torn meniscus and should follow up with Ortho Surgeon.  She had no health insurance and needs Emma care referral. She states that she has sharp non radiating 6/10 shooting pain when she twists and turns her left knee and stabing sensation with weight bearing. She reports that she was informed not to bend her knee. She takes Meloxicam 15 mg daily with relief.She has ace wrap to left lknee and ambulates with a walker. She denies chest pain, palpitation, otherwise denies other concern.   She reports that she has a history of Asthma and has occasional shortness of breath and uses Albuterol as needed.  She states that her last Asthma  execabation was  one year ago. She denies wheezing, chest tightness and cough. Otherwise she denies any concern.  Past Medical History:  Diagnosis Date  . Asthma     Past Surgical History:  Procedure Laterality Date  . CESAREAN SECTION      History reviewed. No pertinent family history.  Social History Social History   Tobacco Use  . Smoking status: Current Every Day Smoker    Packs/day: 0.50    Types: Cigarettes  . Smokeless tobacco: Never Used  Substance Use Topics  . Alcohol use: Yes  . Drug use: No    No Known Allergies  Current Outpatient Medications  Medication Sig Dispense Refill  . albuterol (PROVENTIL HFA;VENTOLIN HFA) 108 (90 Base) MCG/ACT inhaler Inhale 2 puffs into the lungs every 6 (six) hours as needed. 1 Inhaler 0  . meloxicam (MOBIC) 15 MG tablet Take 1 tablet (15 mg total) by mouth daily. 30 tablet 2   No current  facility-administered medications for this visit.     Review of Systems Review of Systems  Constitutional: Negative.   HENT: Negative.   Eyes: Positive for visual disturbance.  Respiratory: Negative.   Cardiovascular: Positive for leg swelling (left knee).  Endocrine: Negative.   Genitourinary: Negative.   Musculoskeletal: Positive for arthralgias (pain to left knee).  Skin: Negative.   Neurological: Negative.   Psychiatric/Behavioral: Negative.     Blood pressure 135/89, pulse 87, height _0  (1.6 m), weight 300 lb (136.1 kg), last menstrual period 02/18/2018, SpO2 98 %.  Physical Exam Physical Exam Constitutional:      Appearance: Normal appearance. She is obese.  HENT:     Head: Normocephalic and atraumatic.     Nose: Nose normal.     Mouth/Throat:     Mouth: Mucous membranes are moist.  Neck:     Musculoskeletal: Normal range of motion.  Cardiovascular:     Rate and Rhythm: Normal rate and regular rhythm.     Pulses: Normal pulses.     Heart sounds: Normal heart sounds.  Pulmonary:     Effort: Pulmonary effort is normal.     Breath sounds: Normal breath sounds.  Abdominal:     Palpations: Abdomen is soft.     Comments: Central obesity  Musculoskeletal:        General: Tenderness (Tenderness  to left leg when bearing weight) and deformity (unable to bend left knee) present.  Skin:    General: Skin is warm and dry.  Neurological:     General: No focal deficit present.     Mental Status: She is alert and oriented to person, place, and time.  Psychiatric:        Mood and Affect: Mood normal.        Behavior: Behavior normal.        Thought Content: Thought content normal.        Judgment: Judgment normal.     Data Reviewed Past medical history and labs were reviewed.  Assessment and Plan   1. Encounter to establish care - Routine labs will be collected. - CBC w/Diff; Future - Comp Met (CMET); Future - HgB A1c; Future - TSH; Future - Urinalysis;  Future - Lipid panel; Future  2. Injury of left knee, initial encounter - She was encouraged to complete Emma care application for Orthopedic Surgery referral.  3. History of asthma - Well controlled, she will continue on current treatment regimen.    Follow up in 3 months or with worsening symptoms.           Dyanne Yorks E Quamaine Webb 03/08/2018, 2:17 PM

## 2018-04-03 ENCOUNTER — Emergency Department
Admission: EM | Admit: 2018-04-03 | Discharge: 2018-04-03 | Disposition: A | Discharge status: Home / Self Care | Payer: Self-pay | Attending: Emergency Medicine | Admitting: Emergency Medicine

## 2018-04-03 ENCOUNTER — Other Ambulatory Visit: Payer: Self-pay

## 2018-04-03 DIAGNOSIS — H6983 Other specified disorders of Eustachian tube, bilateral: Secondary | ICD-10-CM

## 2018-04-03 DIAGNOSIS — J45909 Unspecified asthma, uncomplicated: Secondary | ICD-10-CM | POA: Insufficient documentation

## 2018-04-03 DIAGNOSIS — F17210 Nicotine dependence, cigarettes, uncomplicated: Secondary | ICD-10-CM | POA: Insufficient documentation

## 2018-04-03 DIAGNOSIS — H6993 Unspecified Eustachian tube disorder, bilateral: Secondary | ICD-10-CM | POA: Insufficient documentation

## 2018-04-03 DIAGNOSIS — J0100 Acute maxillary sinusitis, unspecified: Secondary | ICD-10-CM

## 2018-04-03 DIAGNOSIS — J320 Chronic maxillary sinusitis: Secondary | ICD-10-CM | POA: Insufficient documentation

## 2018-04-03 DIAGNOSIS — Z79899 Other long term (current) drug therapy: Secondary | ICD-10-CM | POA: Insufficient documentation

## 2018-04-03 MED ORDER — AMOXICILLIN 500 MG PO CAPS: 500.0000 mg | ORAL_CAPSULE | Freq: Three times a day (TID) | ORAL | 0 refills | Status: DC

## 2018-04-03 MED ORDER — FEXOFENADINE-PSEUDOEPHED ER 60-120 MG PO TB12: 1.0000 | ORAL_TABLET | Freq: Two times a day (BID) | ORAL | 0 refills | Status: DC

## 2018-04-03 NOTE — ED Triage Notes (Signed)
R ear pain, cough, sinus pressure x few weeks. States intermittent symptoms. A&O, ambulatory.

## 2018-04-03 NOTE — ED Notes (Signed)
"  Reports feeling sick x 3 weeks, reports begins to feel better then right back to feeling worse" awaiting md eval and plan of care.

## 2018-04-03 NOTE — ED Provider Notes (Signed)
[facility] Emergency Department Provider Note   ____________________________________________   First MD Initiated Contact with Patient 04/03/18 1318     (approximate)  I have reviewed the triage vital signs and the nursing notes.   HISTORY  Chief Complaint Otalgia and Facial Pain    HPI Emma Hamilton is a 33 y.o. female patient presents with bilateral ear pain right greater than left, sinus pressure, and headache.  Patient denies fever/chills.  Patient denies body aches.  Patient denies nausea, vomiting, or diarrhea.  Patient rates her pain as a 4/10.  Patient described the pain as"pressure".  No palliative measures for complaint.         Past Medical History:  Diagnosis Date  . Asthma     There are no active problems to display for this patient.   Past Surgical History:  Procedure Laterality Date  . CESAREAN SECTION      Prior to Admission medications   Medication Sig Start Date End Date Taking? Authorizing Provider  albuterol (PROVENTIL HFA;VENTOLIN HFA) 108 (90 Base) MCG/ACT inhaler Inhale 2 puffs into the lungs every 6 (six) hours as needed. 08/05/15   Rebecka Apley, MD  amoxicillin (AMOXIL) 500 MG capsule Take 1 capsule (500 mg total) by mouth 3 (three) times daily. 04/03/18   Joni Reining, PA-C  fexofenadine-pseudoephedrine (ALLEGRA-D) 60-120 MG 12 hr tablet Take 1 tablet by mouth 2 (two) times daily. 04/03/18   Joni Reining, PA-C  meloxicam (MOBIC) 15 MG tablet Take 1 tablet (15 mg total) by mouth daily. 02/23/18 02/23/19  Faythe Ghee, PA-C    Allergies Patient has no known allergies.  History reviewed. No pertinent family history.  Social History Social History   Tobacco Use  . Smoking status: Current Every Day Smoker    Packs/day: 0.50    Types: Cigarettes  . Smokeless tobacco: Never Used  Substance Use Topics  . Alcohol use: Yes  . Drug use: No    Review of Systems Constitutional: No fever/chills  Eyes: No visual changes. ENT: No sore throat.  Bilateral ear and sinus pain. Cardiovascular: Denies chest pain. Respiratory: Denies shortness of breath. Gastrointestinal: No abdominal pain.  No nausea, no vomiting.  No diarrhea.  No constipation. Genitourinary: Negative for dysuria. Musculoskeletal: Negative for back pain. Skin: Negative for rash. Neurological: Negative for headaches, focal weakness or numbness.   ____________________________________________   PHYSICAL EXAM:  VITAL SIGNS: ED Triage Vitals  Enc Vitals Group     BP 04/03/18 1256 (!) 147/122     Pulse Rate 04/03/18 1256 98     Resp 04/03/18 1256 18     Temp 04/03/18 1256 98.2 F (36.8 C)     Temp Source 04/03/18 1256 Oral     SpO2 04/03/18 1256 98 %     Weight 04/03/18 1257 300 lb (136.1 kg)     Height 04/03/18 1257 5\' 3"  (1.6 m)     Head Circumference --      Peak Flow --      Pain Score 04/03/18 1257 4     Pain Loc --      Pain Edu? --      Excl. in GC? --    Constitutional: Alert and oriented. Well appearing and in no acute distress. Nose: Bilateral maxillary guarding and edematous nasal turbinates.  Thick rhinorrhea.  Bilateral edematous but non- erythematous TM. Mouth/Throat: Mucous membranes are moist.  Oropharynx non-erythematous.  Postnasal drainage. Neck: No stridor.   Hematological/Lymphatic/Immunilogical: No  cervical lymphadenopathy. Cardiovascular: Normal rate, regular rhythm. Grossly normal heart sounds.  Good peripheral circulation. Respiratory: Normal respiratory effort.  No retractions. Lungs CTAB. Gastrointestinal: Soft and nontender. No distention. No abdominal bruits. No CVA tenderness. Musculoskeletal: No lower extremity tenderness nor edema.  No joint effusions. Neurologic:  Normal speech and language. No gross focal neurologic deficits are appreciated. No gait instability. Skin:  Skin is warm, dry and intact. No rash noted. Psychiatric: Mood and affect are normal. Speech and behavior  are normal.  ____________________________________________   LABS (all labs ordered are listed, but only abnormal results are displayed)  Labs Reviewed - No data to display ____________________________________________  EKG   ____________________________________________  RADIOLOGY  ED MD interpretation:    Official radiology report(s): No results found.  ____________________________________________   PROCEDURES  Procedure(s) performed (including Critical Care):  Procedures   ____________________________________________   INITIAL IMPRESSION / ASSESSMENT AND PLAN / ED COURSE  As part of my medical decision making, I reviewed the following data within the electronic MEDICAL RECORD NUMBER         Sinus pressure and bilateral ear pain secondary to sinusitis and eustachian tube dysfunction.  Patient given discharge care instruction advised take medication as directed.  Patient advised follow-up PCP.      ____________________________________________   FINAL CLINICAL IMPRESSION(S) / ED DIAGNOSES  Final diagnoses:  Subacute maxillary sinusitis  Dysfunction of Eustachian tube, bilateral     ED Discharge Orders         Ordered    amoxicillin (AMOXIL) 500 MG capsule  3 times daily     04/03/18 1347    fexofenadine-pseudoephedrine (ALLEGRA-D) 60-120 MG 12 hr tablet  2 times daily     04/03/18 1347           Note:  This document was prepared using Dragon voice recognition software and may include unintentional dictation errors.    Joni Reining, PA-C 04/03/18 1355    Don Perking, [place], MD 04/03/18 1540

## 2018-06-07 ENCOUNTER — Encounter: Payer: Self-pay | Admitting: Gerontology

## 2018-06-07 ENCOUNTER — Ambulatory Visit: Payer: Self-pay | Admitting: Ophthalmology

## 2018-06-07 ENCOUNTER — Ambulatory Visit: Payer: Self-pay | Admitting: Gerontology

## 2018-06-07 ENCOUNTER — Other Ambulatory Visit: Payer: Self-pay

## 2018-06-07 DIAGNOSIS — H9211 Otorrhea, right ear: Secondary | ICD-10-CM

## 2018-06-07 DIAGNOSIS — Z0000 Encounter for general adult medical examination without abnormal findings: Secondary | ICD-10-CM

## 2018-06-07 DIAGNOSIS — S8992XA Unspecified injury of left lower leg, initial encounter: Secondary | ICD-10-CM

## 2018-06-07 NOTE — Progress Notes (Signed)
Established Patient Office Visit  Subjective:  Patient ID: Emma Hamilton, female    DOB: July 24, 1985  Age: 33 y.o. MRN: [id number]  CC:  Chief Complaint  Patient presents with  . Follow-up    improving left knee   Patient consents to telephone visit and 2 patient identifiers was used to identify patient.  HPI Emma Hamilton presents for follow up after ED visit on 04/03/2018 and was treated with amoxicillin and Allegra for acute maxillary sinusitis and dysfunction of eustachian tube.  She reports that she continues to feel pressure inside her right ear and continues to have whitish pasty drainage  from her right ear daily.  She reports experiencing decreased hearing to the right ear with the accumulation of the whitish pasty substance and she has to clean her ear daily.  She denies otalgia, facial pain, headache and sinus pressure.  She reports that the injury to her left knee has healed and she never followed up with the orthopedic surgeon, but she continues to experience intermittent severe non radiating 9/10 pain to left knee when standing up from a sitting position. She states that the pain resolves within 3 to 5 seconds.  She denies pain with walking, chest pain, palpitation, fever, chills and no further concerns.  Past Medical History:  Diagnosis Date  . Asthma     Past Surgical History:  Procedure Laterality Date  . CESAREAN SECTION      History reviewed. No pertinent family history.  Social History   Socioeconomic History  . Marital status: Married    Spouse name: Not on file  . Number of children: Not on file  . Years of education: Not on file  . Highest education level: Not on file  Occupational History  . Not on file  Social Needs  . Financial resource strain: Not on file  . Food insecurity:    Worry: Not on file    Inability: Not on file  . Transportation needs:    Medical: Not on file    Non-medical: Not on file  Tobacco Use  . Smoking status:  Current Every Day Smoker    Packs/day: 0.50    Types: Cigarettes  . Smokeless tobacco: Never Used  Substance and Sexual Activity  . Alcohol use: Yes  . Drug use: No  . Sexual activity: Not on file  Lifestyle  . Physical activity:    Days per week: Not on file    Minutes per session: Not on file  . Stress: Not on file  Relationships  . Social connections:    Talks on phone: Not on file    Gets together: Not on file    Attends religious service: Not on file    Active member of club or organization: Not on file    Attends meetings of clubs or organizations: Not on file    Relationship status: Not on file  . Intimate partner violence:    Fear of current or ex partner: Not on file    Emotionally abused: Not on file    Physically abused: Not on file    Forced sexual activity: Not on file  Other Topics Concern  . Not on file  Social History Narrative  . Not on file    Outpatient Medications Prior to Visit  Medication Sig Dispense Refill  . albuterol (PROVENTIL HFA;VENTOLIN HFA) 108 (90 Base) MCG/ACT inhaler Inhale 2 puffs into the lungs every 6 (six) hours as needed. 1 Inhaler 0  . ibuprofen (  ADVIL) 200 MG tablet Take 200 mg by mouth 2 (two) times a day.    . fexofenadine-pseudoephedrine (ALLEGRA-D) 60-120 MG 12 hr tablet Take 1 tablet by mouth 2 (two) times daily. (Patient not taking: Reported on 06/07/2018) 20 tablet 0  . amoxicillin (AMOXIL) 500 MG capsule Take 1 capsule (500 mg total) by mouth 3 (three) times daily. 30 capsule 0  . meloxicam (MOBIC) 15 MG tablet Take 1 tablet (15 mg total) by mouth daily. 30 tablet 2   No facility-administered medications prior to visit.     No Known Allergies  ROS Review of Systems  Constitutional: Negative.   HENT: Positive for ear discharge. Negative for ear pain, facial swelling, hearing loss, sinus pressure, sinus pain, sore throat and tinnitus.   Respiratory: Negative.   Cardiovascular: Negative.   Genitourinary: Negative.    Musculoskeletal: Positive for arthralgias (intermittent pain to left knee).  Skin: Negative.   Neurological: Negative.   Psychiatric/Behavioral: Negative.       Objective:    Physical Exam No vital sign or PE done. There were no vitals taken for this visit. Wt Readings from Last 3 Encounters:  04/03/18 300 lb (136.1 kg)  03/08/18 300 lb (136.1 kg)  02/23/18 300 lb (136.1 kg)     Health Maintenance Due  Topic Date Due  . HIV Screening  08/25/2000  . TETANUS/TDAP  08/25/2004  . PAP SMEAR-Modifier  08/26/2006    There are no preventive care reminders to display for this patient.  No results found for: TSH Lab Results  Component Value Date   WBC 12.9 (H) 03/25/2017   HGB 12.8 03/25/2017   HCT 39.0 03/25/2017   MCV 89.0 03/25/2017   PLT 295 03/25/2017   Lab Results  Component Value Date   NA 139 03/25/2017   K 3.8 03/25/2017   CO2 30 03/25/2017   GLUCOSE 98 03/25/2017   BUN 15 03/25/2017   CREATININE 0.84 03/25/2017   BILITOT 0.6 03/25/2017   ALKPHOS 73 03/25/2017   AST 20 03/25/2017   ALT 28 03/25/2017   PROT 7.2 03/25/2017   ALBUMIN 4.0 03/25/2017   CALCIUM 8.5 (L) 03/25/2017   ANIONGAP 5 03/25/2017   No results found for: CHOL No results found for: HDL No results found for: LDLCALC No results found for: TRIG No results found for: CHOLHDL No results found for: HGBA1C    Assessment & Plan:     1. Discharge of ear, right -She was advised to notify clinic with worsening symptoms.  2. Injury of left knee,  -She will follow-up with Dr. Vickki Hearing orthopedic at the clinic.  3. Health care maintenance -Routine labs will be collected. - Lipid panel; Future - CBC w/Diff; Future - Comp Met (CMET); Future - TSH; Future - HgB A1c; Future - Urinalysis; Future   Follow-up: Return in about 6 weeks (around 07/19/2018), or if symptoms worsen or fail to improve.    Alease Fait Jerold Coombe, NP

## 2018-07-04 ENCOUNTER — Other Ambulatory Visit: Payer: Self-pay

## 2018-07-19 ENCOUNTER — Other Ambulatory Visit: Payer: Self-pay

## 2018-07-31 ENCOUNTER — Ambulatory Visit: Payer: Self-pay | Admitting: Gerontology

## 2018-07-31 ENCOUNTER — Other Ambulatory Visit: Payer: Self-pay

## 2018-07-31 VITALS — BP 154/97 | HR 90 | Ht 63.75 in | Wt 341.0 lb

## 2018-07-31 DIAGNOSIS — H9211 Otorrhea, right ear: Secondary | ICD-10-CM | POA: Insufficient documentation

## 2018-07-31 DIAGNOSIS — R030 Elevated blood-pressure reading, without diagnosis of hypertension: Secondary | ICD-10-CM | POA: Insufficient documentation

## 2018-07-31 MED ORDER — CORTISPORIN-TC 3.3-3-10-0.5 MG/ML OT SUSP: 3.0000 [drp] | Freq: Four times a day (QID) | OTIC | 0 refills | Status: DC

## 2018-07-31 MED ORDER — BLOOD PRESSURE MONITOR AUTOMAT DEVI: 1.0000 | Freq: Two times a day (BID) | 0 refills | Status: AC

## 2018-07-31 NOTE — Progress Notes (Signed)
Established Patient Office Visit  Subjective:  Patient ID: Emma Hamilton, female    DOB: Sep 01, 1985  Age: 33 y.o. MRN: [id number]  CC:  Chief Complaint  Patient presents with  . Follow-up    knee is improving, R ear infection    HPI Emma Hamilton presents for ear infection. She reports that she's experiencing constant whitish discharge with sour cream consistency from her right ear. She states that if she doesn't clean the discharge, it will dry up and clog her ear canal. She also experiences occasional pain to her right mastoid bone and tragus. She denies vertigo, sinus pain, headache, fever and chills, but c/o intermittent tinnitus to left ear. She continues to experience intermittent sharp pain to left knee when she stands up, but reports that it has improved 85%. She denies chest pain, palpitation, shortness of breath and offers no further concern.  Past Medical History:  Diagnosis Date  . Asthma     Past Surgical History:  Procedure Laterality Date  . CESAREAN SECTION      No family history on file.  Social History   Socioeconomic History  . Marital status: Married    Spouse name: Not on file  . Number of children: Not on file  . Years of education: Not on file  . Highest education level: Not on file  Occupational History  . Not on file  Social Needs  . Financial resource strain: Not on file  . Food insecurity    Worry: Not on file    Inability: Not on file  . Transportation needs    Medical: Not on file    Non-medical: Not on file  Tobacco Use  . Smoking status: Current Every Day Smoker    Packs/day: 0.50    Types: Cigarettes  . Smokeless tobacco: Never Used  Substance and Sexual Activity  . Alcohol use: Yes  . Drug use: No  . Sexual activity: Not on file  Lifestyle  . Physical activity    Days per week: Not on file    Minutes per session: Not on file  . Stress: Not on file  Relationships  . Social Herbalist on phone: Not on  file    Gets together: Not on file    Attends religious service: Not on file    Active member of club or organization: Not on file    Attends meetings of clubs or organizations: Not on file    Relationship status: Not on file  . Intimate partner violence    Fear of current or ex partner: Not on file    Emotionally abused: Not on file    Physically abused: Not on file    Forced sexual activity: Not on file  Other Topics Concern  . Not on file  Social History Narrative  . Not on file    Outpatient Medications Prior to Visit  Medication Sig Dispense Refill  . albuterol (PROVENTIL HFA;VENTOLIN HFA) 108 (90 Base) MCG/ACT inhaler Inhale 2 puffs into the lungs every 6 (six) hours as needed. 1 Inhaler 0  . fexofenadine-pseudoephedrine (ALLEGRA-D) 60-120 MG 12 hr tablet Take 1 tablet by mouth 2 (two) times daily. (Patient not taking: Reported on 06/07/2018) 20 tablet 0  . ibuprofen (ADVIL) 200 MG tablet Take 200 mg by mouth 2 (two) times a day.     No facility-administered medications prior to visit.     No Known Allergies  ROS Review of Systems  Constitutional: Negative.  HENT: Positive for ear discharge (sour cream consistency), ear pain and tinnitus (left ear). Negative for sinus pressure and sore throat.   Respiratory: Negative.   Cardiovascular: Negative.   Musculoskeletal: Positive for arthralgias (intermittent left knee pain).  Skin: Negative.   Neurological: Negative.   Psychiatric/Behavioral: Negative.       Objective:    Physical Exam  Constitutional: She is oriented to person, place, and time. She appears well-developed and well-nourished.  HENT:  Head: Normocephalic.  Right Ear: Hearing, external ear and ear canal normal. No foreign bodies. Tympanic membrane is erythematous (mild erythema right ear). Tympanic membrane is not perforated. No decreased hearing is noted.  Left Ear: Hearing, tympanic membrane, external ear and ear canal normal.  Eyes: Pupils are equal,  round, and reactive to light. EOM are normal.  Neck: Normal range of motion.  Cardiovascular: Normal rate and regular rhythm.  Pulmonary/Chest: Effort normal and breath sounds normal.  Musculoskeletal: Normal range of motion.  Neurological: She is alert and oriented to person, place, and time.  Skin: Skin is warm and dry.  Psychiatric: She has a normal mood and affect. Her behavior is normal. Judgment and thought content normal.    BP (!) 154/97 (BP Location: Left Arm, Patient Position: Sitting)   Pulse 90   Ht 5' 3.75" (1.619 m)   Wt (!) 341 lb (154.7 kg)   SpO2 99%   BMI 58.99 kg/m  Wt Readings from Last 3 Encounters:  07/31/18 (!) 341 lb (154.7 kg)  04/03/18 300 lb (136.1 kg)  03/08/18 300 lb (136.1 kg)   She was encouraged to lose weight.  Health Maintenance Due  Topic Date Due  . HIV Screening  08/25/2000  . PAP SMEAR-Modifier  08/26/2006    There are no preventive care reminders to display for this patient.  No results found for: TSH Lab Results  Component Value Date   WBC 12.9 (H) 03/25/2017   HGB 12.8 03/25/2017   HCT 39.0 03/25/2017   MCV 89.0 03/25/2017   PLT 295 03/25/2017   Lab Results  Component Value Date   NA 139 03/25/2017   K 3.8 03/25/2017   CO2 30 03/25/2017   GLUCOSE 98 03/25/2017   BUN 15 03/25/2017   CREATININE 0.84 03/25/2017   BILITOT 0.6 03/25/2017   ALKPHOS 73 03/25/2017   AST 20 03/25/2017   ALT 28 03/25/2017   PROT 7.2 03/25/2017   ALBUMIN 4.0 03/25/2017   CALCIUM 8.5 (L) 03/25/2017   ANIONGAP 5 03/25/2017   No results found for: CHOL No results found for: HDL No results found for: LDLCALC No results found for: TRIG No results found for: CHOLHDL No results found for: HGBA1C    Assessment & Plan:     1. Discharge from ear, right - She will use cortisporin ear drop and was advised to go to the ED with worsening symptoms. - neomycin-colistin-hydrocortisone-thonzonium (CORTISPORIN-TC) 3.03-19-08-0.5 MG/ML OTIC suspension;  Place 3 drops into the right ear 4 (four) times daily.  Dispense: 10 mL; Refill: 0  2. Elevated blood pressure reading - She was advised to check and record blood pressure daily and bring log to follow up visit. - She was also advised on dash diet, and was strongly advised to lose weight. - Blood Pressure Monitoring (BLOOD PRESSURE MONITOR AUTOMAT) DEVI; 1 each by Does not apply route 2 (two) times daily.  Dispense: 1 kit; Refill: 0   Follow-up: Return in about 3 weeks (around 08/21/2018), or if symptoms worsen or fail  to improve.    Adabella Stanis Jerold Coombe, NP

## 2018-07-31 NOTE — Patient Instructions (Signed)
° °Calorie Counting for Weight Loss °Calories are units of energy. Your body needs a certain amount of calories from food to keep you going throughout the day. When you eat more calories than your body needs, your body stores the extra calories as fat. When you eat fewer calories than your body needs, your body burns fat to get the energy it needs. °Calorie counting means keeping track of how many calories you eat and drink each day. Calorie counting can be helpful if you need to lose weight. If you make sure to eat fewer calories than your body needs, you should lose weight. Ask your health care provider what a healthy weight is for you. °For calorie counting to work, you will need to eat the right number of calories in a day in order to lose a healthy amount of weight per week. A dietitian can help you determine how many calories you need in a day and will give you suggestions on how to reach your calorie goal. °· A healthy amount of weight to lose per week is usually 1-2 lb (0.5-0.9 kg). This usually means that your daily calorie intake should be reduced by 500-750 calories. °· Eating 1,200 - 1,500 calories per day can help most women lose weight. °· Eating 1,500 - 1,800 calories per day can help most men lose weight. °What is my plan? °My goal is to have __________ calories per day. °If I have this many calories per day, I should lose around __________ pounds per week. °What do I need to know about calorie counting? °In order to meet your daily calorie goal, you will need to: °· Find out how many calories are in each food you would like to eat. Try to do this before you eat. °· Decide how much of the food you plan to eat. °· Write down what you ate and how many calories it had. Doing this is called keeping a food log. °To successfully lose weight, it is important to balance calorie counting with a healthy lifestyle that includes regular activity. Aim for 150 minutes of moderate exercise (such as walking) or 75  minutes of vigorous exercise (such as running) each week. °Where do I find calorie information? ° °The number of calories in a food can be found on a Nutrition Facts label. If a food does not have a Nutrition Facts label, try to look up the calories online or ask your dietitian for help. °Remember that calories are listed per serving. If you choose to have more than one serving of a food, you will have to multiply the calories per serving by the amount of servings you plan to eat. For example, the label on a package of bread might say that a serving size is 1 slice and that there are 90 calories in a serving. If you eat 1 slice, you will have eaten 90 calories. If you eat 2 slices, you will have eaten 180 calories. °How do I keep a food log? °Immediately after each meal, record the following information in your food log: °· What you ate. Don't forget to include toppings, sauces, and other extras on the food. °· How much you ate. This can be measured in cups, ounces, or number of items. °· How many calories each food and drink had. °· The total number of calories in the meal. °Keep your food log near you, such as in a small notebook in your pocket, or use a mobile app or website. Some programs will   calculate calories for you and show you how many calories you have left for the day to meet your goal. °What are some calorie counting tips? ° °· Use your calories on foods and drinks that will fill you up and not leave you hungry: °? Some examples of foods that fill you up are nuts and nut butters, vegetables, lean proteins, and high-fiber foods like whole grains. High-fiber foods are foods with more than 5 g fiber per serving. °? Drinks such as sodas, specialty coffee drinks, alcohol, and juices have a lot of calories, yet do not fill you up. °· Eat nutritious foods and avoid empty calories. Empty calories are calories you get from foods or beverages that do not have many vitamins or protein, such as candy, sweets, and  soda. It is better to have a nutritious high-calorie food (such as an avocado) than a food with few nutrients (such as a bag of chips). °· Know how many calories are in the foods you eat most often. This will help you calculate calorie counts faster. °· Pay attention to calories in drinks. Low-calorie drinks include water and unsweetened drinks. °· Pay attention to nutrition labels for "low fat" or "fat free" foods. These foods sometimes have the same amount of calories or more calories than the full fat versions. They also often have added sugar, starch, or salt, to make up for flavor that was removed with the fat. °· Find a way of tracking calories that works for you. Get creative. Try different apps or programs if writing down calories does not work for you. °What are some portion control tips? °· Know how many calories are in a serving. This will help you know how many servings of a certain food you can have. °· Use a measuring cup to measure serving sizes. You could also try weighing out portions on a kitchen scale. With time, you will be able to estimate serving sizes for some foods. °· Take some time to put servings of different foods on your favorite plates, bowls, and cups so you know what a serving looks like. °· Try not to eat straight from a bag or box. Doing this can lead to overeating. Put the amount you would like to eat in a cup or on a plate to make sure you are eating the right portion. °· Use smaller plates, glasses, and bowls to prevent overeating. °· Try not to multitask (for example, watch TV or use your computer) while eating. If it is time to eat, sit down at a table and enjoy your food. This will help you to know when you are full. It will also help you to be aware of what you are eating and how much you are eating. °What are tips for following this plan? °Reading food labels °· Check the calorie count compared to the serving size. The serving size may be smaller than what you are used to  eating. °· Check the source of the calories. Make sure the food you are eating is high in vitamins and protein and low in saturated and trans fats. °Shopping °· Read nutrition labels while you shop. This will help you make healthy decisions before you decide to purchase your food. °· Make a grocery list and stick to it. °Cooking °· Try to cook your favorite foods in a healthier way. For example, try baking instead of frying. °· Use low-fat dairy products. °Meal planning °· Use more fruits and vegetables. Half of your plate should be   fruits and vegetables. °· Include lean proteins like poultry and fish. °How do I count calories when eating out? °· Ask for smaller portion sizes. °· Consider sharing an entree and sides instead of getting your own entree. °· If you get your own entree, eat only half. Ask for a box at the beginning of your meal and put the rest of your entree in it so you are not tempted to eat it. °· If calories are listed on the menu, choose the lower calorie options. °· Choose dishes that include vegetables, fruits, whole grains, low-fat dairy products, and lean protein. °· Choose items that are boiled, broiled, grilled, or steamed. Stay away from items that are buttered, battered, fried, or served with cream sauce. Items labeled "crispy" are usually fried, unless stated otherwise. °· Choose water, low-fat milk, unsweetened iced tea, or other drinks without added sugar. If you want an alcoholic beverage, choose a lower calorie option such as a glass of wine or light beer. °· Ask for dressings, sauces, and syrups on the side. These are usually high in calories, so you should limit the amount you eat. °· If you want a salad, choose a garden salad and ask for grilled meats. Avoid extra toppings like bacon, cheese, or fried items. Ask for the dressing on the side, or ask for olive oil and vinegar or lemon to use as dressing. °· Estimate how many servings of a food you are given. For example, a serving of  cooked rice is ½ cup or about the size of half a baseball. Knowing serving sizes will help you be aware of how much food you are eating at restaurants. The list below tells you how big or small some common portion sizes are based on everyday objects: °? 1 oz--4 stacked dice. °? 3 oz--1 deck of cards. °? 1 tsp--1 die. °? 1 Tbsp--½ a ping-pong ball. °? 2 Tbsp--1 ping-pong ball. °? ½ cup--½ baseball. °? 1 cup--1 baseball. °Summary °· Calorie counting means keeping track of how many calories you eat and drink each day. If you eat fewer calories than your body needs, you should lose weight. °· A healthy amount of weight to lose per week is usually 1-2 lb (0.5-0.9 kg). This usually means reducing your daily calorie intake by 500-750 calories. °· The number of calories in a food can be found on a Nutrition Facts label. If a food does not have a Nutrition Facts label, try to look up the calories online or ask your dietitian for help. °· Use your calories on foods and drinks that will fill you up, and not on foods and drinks that will leave you hungry. °· Use smaller plates, glasses, and bowls to prevent overeating. °This information is not intended to replace advice given to you by your health care provider. Make sure you discuss any questions you have with your health care provider. °Document Released: 01/03/2005 Document Revised: 09/22/2017 Document Reviewed: 12/04/2015 °Elsevier Patient Education © 2020 [employer] ° °

## 2018-08-16 ENCOUNTER — Other Ambulatory Visit: Payer: Self-pay

## 2018-08-21 ENCOUNTER — Ambulatory Visit: Payer: Self-pay | Admitting: Gerontology

## 2018-09-12 ENCOUNTER — Other Ambulatory Visit: Payer: Self-pay

## 2018-09-17 ENCOUNTER — Ambulatory Visit: Payer: Self-pay | Admitting: Gerontology

## 2018-09-19 ENCOUNTER — Other Ambulatory Visit: Payer: Self-pay

## 2018-09-19 DIAGNOSIS — Z0000 Encounter for general adult medical examination without abnormal findings: Secondary | ICD-10-CM

## 2018-09-20 LAB — HEMOGLOBIN A1C
Est. average glucose Bld gHb Est-mCnc: 111 mg/dL
Hgb A1c MFr Bld: 5.5 % (ref 4.8–5.6)

## 2018-09-20 LAB — COMPREHENSIVE METABOLIC PANEL
ALT: 19 IU/L (ref 0–32)
AST: 14 IU/L (ref 0–40)
Albumin/Globulin Ratio: 1.9 (ref 1.2–2.2)
Albumin: 4.1 g/dL (ref 3.8–4.8)
Alkaline Phosphatase: 80 IU/L (ref 39–117)
BUN/Creatinine Ratio: 18 (ref 9–23)
BUN: 12 mg/dL (ref 6–20)
Bilirubin Total: 0.3 mg/dL (ref 0.0–1.2)
CO2: 22 mmol/L (ref 20–29)
Calcium: 8.9 mg/dL (ref 8.7–10.2)
Chloride: 103 mmol/L (ref 96–106)
Creatinine, Ser: 0.68 mg/dL (ref 0.57–1.00)
GFR calc Af Amer: 133 mL/min/{1.73_m2} (ref >59)
GFR calc non Af Amer: 115 mL/min/{1.73_m2} (ref >59)
Globulin, Total: 2.2 g/dL (ref 1.5–4.5)
Glucose: 96 mg/dL (ref 65–99)
Potassium: 4.2 mmol/L (ref 3.5–5.2)
Sodium: 139 mmol/L (ref 134–144)
Total Protein: 6.3 g/dL (ref 6.0–8.5)

## 2018-09-20 LAB — URINALYSIS
Bilirubin, UA: NEGATIVE
Glucose, UA: NEGATIVE
Ketones, UA: NEGATIVE
Leukocytes,UA: NEGATIVE
Nitrite, UA: NEGATIVE
Protein,UA: NEGATIVE
Specific Gravity, UA: 1.02 (ref 1.005–1.030)
Urobilinogen, Ur: 0.2 mg/dL (ref 0.2–1.0)
pH, UA: 5 (ref 5.0–7.5)

## 2018-09-20 LAB — CBC WITH DIFFERENTIAL/PLATELET
Basophils Absolute: 0 10*3/uL (ref 0.0–0.2)
Basos: 0 %
EOS (ABSOLUTE): 0.1 10*3/uL (ref 0.0–0.4)
Eos: 2 %
Hematocrit: 37.9 % (ref 34.0–46.6)
Hemoglobin: 12.7 g/dL (ref 11.1–15.9)
Immature Grans (Abs): 0 10*3/uL (ref 0.0–0.1)
Immature Granulocytes: 0 %
Lymphocytes Absolute: 2.8 10*3/uL (ref 0.7–3.1)
Lymphs: 29 %
MCH: 29.2 pg (ref 26.6–33.0)
MCHC: 33.5 g/dL (ref 31.5–35.7)
MCV: 87 fL (ref 79–97)
Monocytes Absolute: 0.4 10*3/uL (ref 0.1–0.9)
Monocytes: 5 %
Neutrophils Absolute: 6 10*3/uL (ref 1.4–7.0)
Neutrophils: 64 %
Platelets: 286 10*3/uL (ref 150–450)
RBC: 4.35 x10E6/uL (ref 3.77–5.28)
RDW: 12.9 % (ref 11.7–15.4)
WBC: 9.5 10*3/uL (ref 3.4–10.8)

## 2018-09-20 LAB — LIPID PANEL
Chol/HDL Ratio: 4.3 ratio (ref 0.0–4.4)
Cholesterol, Total: 171 mg/dL (ref 100–199)
HDL: 40 mg/dL (ref >39)
LDL Chol Calc (NIH): 113 mg/dL — ABNORMAL HIGH (ref 0–99)
Triglycerides: 98 mg/dL (ref 0–149)
VLDL Cholesterol Cal: 18 mg/dL (ref 5–40)

## 2018-09-20 LAB — TSH: TSH: 3.02 u[IU]/mL (ref 0.450–4.500)

## 2018-09-26 ENCOUNTER — Other Ambulatory Visit: Payer: Self-pay

## 2018-09-26 ENCOUNTER — Ambulatory Visit: Payer: Self-pay | Admitting: Gerontology

## 2018-09-26 ENCOUNTER — Encounter: Payer: Self-pay | Admitting: Gerontology

## 2018-09-26 VITALS — BP 141/89 | HR 88 | Temp 98.0°F | Ht 63.0 in | Wt 334.2 lb

## 2018-09-26 DIAGNOSIS — E785 Hyperlipidemia, unspecified: Secondary | ICD-10-CM

## 2018-09-26 DIAGNOSIS — Z0000 Encounter for general adult medical examination without abnormal findings: Secondary | ICD-10-CM

## 2018-09-26 DIAGNOSIS — R030 Elevated blood-pressure reading, without diagnosis of hypertension: Secondary | ICD-10-CM

## 2018-09-26 NOTE — Progress Notes (Signed)
Established Patient Office Visit  Subjective:  Patient ID: Emma Hamilton, female    DOB: May 22, 1985  Age: 33 y.o. MRN: [id number]  CC: No chief complaint on file. She consents to telephone visit and 2 patient identifiers was used to identify patient.  HPI Ralene Tausha Milhoan presents for follow up of discharge from right ear, elevated blood pressure and lab review. She reports that the discharge from her right ear has resolved. She checks her blood pressure at home, admits to misplacing her log and states that her blood pressure is usually in the 120's/80. She continues to smoke 2 packs of cigarette weekly, and denies the desire to quit. Her labs done 1 week ago was reviewed, LDL was 113 mg/dl, trace RBC in urine. She denies chest pain, shortness of breath, palpitation, light headedness, fever and chills. She reports that she's doing well and offers no further concern.  Past Medical History:  Diagnosis Date  . Asthma     Past Surgical History:  Procedure Laterality Date  . CESAREAN SECTION      No family history on file.  Social History   Socioeconomic History  . Marital status: Married    Spouse name: Not on file  . Number of children: Not on file  . Years of education: Not on file  . Highest education level: Not on file  Occupational History  . Not on file  Social Needs  . Financial resource strain: Not on file  . Food insecurity    Worry: Not on file    Inability: Not on file  . Transportation needs    Medical: Not on file    Non-medical: Not on file  Tobacco Use  . Smoking status: Current Every Day Smoker    Packs/day: 0.50    Types: Cigarettes  . Smokeless tobacco: Never Used  Substance and Sexual Activity  . Alcohol use: Yes  . Drug use: No  . Sexual activity: Not on file  Lifestyle  . Physical activity    Days per week: Not on file    Minutes per session: Not on file  . Stress: Not on file  Relationships  . Social Herbalist on  phone: Not on file    Gets together: Not on file    Attends religious service: Not on file    Active member of club or organization: Not on file    Attends meetings of clubs or organizations: Not on file    Relationship status: Not on file  . Intimate partner violence    Fear of current or ex partner: Not on file    Emotionally abused: Not on file    Physically abused: Not on file    Forced sexual activity: Not on file  Other Topics Concern  . Not on file  Social History Narrative  . Not on file    Outpatient Medications Prior to Visit  Medication Sig Dispense Refill  . Blood Pressure Monitoring (BLOOD PRESSURE MONITOR AUTOMAT) DEVI 1 each by Does not apply route 2 (two) times daily. 1 kit 0  . neomycin-colistin-hydrocortisone-thonzonium (CORTISPORIN-TC) 3.03-19-08-0.5 MG/ML OTIC suspension Place 3 drops into the right ear 4 (four) times daily. (Patient not taking: Reported on 09/26/2018) 10 mL 0   No facility-administered medications prior to visit.     No Known Allergies  ROS Review of Systems  Constitutional: Negative.   Eyes: Negative.   Respiratory: Negative.   Cardiovascular: Negative.   Gastrointestinal: Negative.   Endocrine:  Negative.   Genitourinary: Negative.   Skin: Negative.   Neurological: Negative.   Psychiatric/Behavioral: Negative.       Objective:    Physical Exam No PE was done, telephone visit. BP (!) 141/89 Comment: taken on 9/2  Pulse 88   Temp 98 F (36.7 C)   Ht 5' 3" (1.6 m)   Wt (!) 334 lb 3.2 oz (151.6 kg)   SpO2 98%   BMI 59.20 kg/m  Wt Readings from Last 3 Encounters:  09/26/18 (!) 334 lb 3.2 oz (151.6 kg)  07/31/18 (!) 341 lb (154.7 kg)  04/03/18 300 lb (136.1 kg)   She was encouraged to continue on weight loss regimen.  Health Maintenance Due  Topic Date Due  . HIV Screening  08/25/2000  . PAP SMEAR-Modifier  08/26/2006  . INFLUENZA VACCINE  08/18/2018    There are no preventive care reminders to display for this  patient.  Lab Results  Component Value Date   TSH 3.020 09/19/2018   Lab Results  Component Value Date   WBC 9.5 09/19/2018   HGB 12.7 09/19/2018   HCT 37.9 09/19/2018   MCV 87 09/19/2018   PLT 286 09/19/2018   Lab Results  Component Value Date   NA 139 09/19/2018   K 4.2 09/19/2018   CO2 22 09/19/2018   GLUCOSE 96 09/19/2018   BUN 12 09/19/2018   CREATININE 0.68 09/19/2018   BILITOT 0.3 09/19/2018   ALKPHOS 80 09/19/2018   AST 14 09/19/2018   ALT 19 09/19/2018   PROT 6.3 09/19/2018   ALBUMIN 4.1 09/19/2018   CALCIUM 8.9 09/19/2018   ANIONGAP 5 03/25/2017   Lab Results  Component Value Date   CHOL 171 09/19/2018   Lab Results  Component Value Date   HDL 40 09/19/2018   No results found for: [facility] Lab Results  Component Value Date   TRIG 98 09/19/2018   Lab Results  Component Value Date   CHOLHDL 4.3 09/19/2018   Lab Results  Component Value Date   HGBA1C 5.5 09/19/2018      Assessment & Plan:     1. Elevated blood pressure reading - She was encouraged to check and document blood pressure daily, record and notify clinic for blood pressure > 140/90. She was encouraged to continue on dash diet and exercise as tolerated.  2. Morbid obesity ([place]) - She was advised to continue on weight loss regimen. -Eat more salad without dressing or with low fat [place] dressing -Avoid and juices, or sodas or any regular sweets/cakes/candies -Decrease the amount of bread, rice, potatoes, pasta or similar carbohydrates rich food -Small portion of food -Decrease consumption of fried food. -Grilled or baked chicken, fish or [place] can be used, avoid red meat -Walk regularly at least 30 minutes daily as tolerated   3. Elevated lipids - Her LDL was 113 mg/dl, and she was advised to continue on low fat Diet, like low fat dairy products eg skimmed milk -Avoid any fried food -Regular exercise/walk -Goal for Total Cholesterol is less than 200 -Goal for bad  cholesterol LDL is less than 100 -Goal for Good cholesterol HDL is more than 45 -Goal for Triglyceride is less than 150    4. Health care maintenance - She was to call and schedule appointment for Pap smear. - Ambulatory referral to Hematology / Oncology    Follow-up: Return in about 6 months (around 03/26/2019), or if symptoms worsen or fail to improve.    Chioma Jerold Coombe, NP

## 2018-10-26 ENCOUNTER — Emergency Department: Payer: Self-pay

## 2018-10-26 ENCOUNTER — Emergency Department
Admission: EM | Admit: 2018-10-26 | Discharge: 2018-10-26 | Disposition: A | Discharge status: Home / Self Care | Payer: Self-pay | Attending: Student in an Organized Health Care Education/Training Program | Admitting: Student in an Organized Health Care Education/Training Program

## 2018-10-26 ENCOUNTER — Encounter: Payer: Self-pay | Admitting: Emergency Medicine

## 2018-10-26 ENCOUNTER — Other Ambulatory Visit: Payer: Self-pay

## 2018-10-26 DIAGNOSIS — F17210 Nicotine dependence, cigarettes, uncomplicated: Secondary | ICD-10-CM | POA: Insufficient documentation

## 2018-10-26 DIAGNOSIS — Z20828 Contact with and (suspected) exposure to other viral communicable diseases: Secondary | ICD-10-CM | POA: Insufficient documentation

## 2018-10-26 DIAGNOSIS — J45909 Unspecified asthma, uncomplicated: Secondary | ICD-10-CM | POA: Insufficient documentation

## 2018-10-26 DIAGNOSIS — R0789 Other chest pain: Secondary | ICD-10-CM | POA: Insufficient documentation

## 2018-10-26 LAB — BASIC METABOLIC PANEL
Anion gap: 9 (ref 5–15)
BUN: 21 mg/dL — ABNORMAL HIGH (ref 6–20)
CO2: 24 mmol/L (ref 22–32)
Calcium: 9 mg/dL (ref 8.9–10.3)
Chloride: 105 mmol/L (ref 98–111)
Creatinine, Ser: 0.56 mg/dL (ref 0.44–1.00)
GFR calc Af Amer: >60 mL/min (ref >60)
GFR calc non Af Amer: >60 mL/min (ref >60)
Glucose, Bld: 104 mg/dL — ABNORMAL HIGH (ref 70–99)
Potassium: 4 mmol/L (ref 3.5–5.1)
Sodium: 138 mmol/L (ref 135–145)

## 2018-10-26 LAB — TROPONIN I (HIGH SENSITIVITY)
Troponin I (High Sensitivity): <2 ng/L (ref <18)
Troponin I (High Sensitivity): 3 ng/L (ref <18)

## 2018-10-26 LAB — CBC
HCT: 38.5 % (ref 36.0–46.0)
Hemoglobin: 12.5 g/dL (ref 12.0–15.0)
MCH: 29.1 pg (ref 26.0–34.0)
MCHC: 32.5 g/dL (ref 30.0–36.0)
MCV: 89.7 fL (ref 80.0–100.0)
Platelets: 309 10*3/uL (ref 150–400)
RBC: 4.29 MIL/uL (ref 3.87–5.11)
RDW: 12.8 % (ref 11.5–15.5)
WBC: 9.8 10*3/uL (ref 4.0–10.5)
nRBC: 0 % (ref 0.0–0.2)

## 2018-10-26 LAB — SARS CORONAVIRUS 2 BY RT PCR (HOSPITAL ORDER, PERFORMED IN ~~LOC~~ HOSPITAL LAB): SARS Coronavirus 2: NEGATIVE

## 2018-10-26 LAB — FIBRIN DERIVATIVES D-DIMER (ARMC ONLY): Fibrin derivatives D-dimer (ARMC): 297.35 ng/mL (FEU) (ref 0.00–499.00)

## 2018-10-26 MED ORDER — OMEPRAZOLE 40 MG PO CPDR: 40.0000 mg | DELAYED_RELEASE_CAPSULE | Freq: Every day | ORAL | 0 refills | Status: DC

## 2018-10-26 NOTE — ED Triage Notes (Addendum)
Pt arrives with complaints of squeezing/achy central chest pain that woke pt from her sleep this morning. Pt reports similar episode one week prior.

## 2018-10-26 NOTE — ED Provider Notes (Signed)
[facility] Emergency Department Provider Note    First MD Initiated Contact with Patient 10/26/18 [phone]     (approximate)  I have reviewed the triage vital signs and the nursing notes.   HISTORY  Chief Complaint Chest Pain    HPI Emma Hamilton is a 33 y.o. female with the below listed past medical history presents the ER for midsternal nonradiating chest pain that awoke her from sleep this morning.  States she is having some intermittent chest discomfort and pressure over the past week.  Also having some tingling in the tip of her jaw.  States that they just recently purchased a house and has been stressed related to that but due to the discomfort in her jaw her husband told her to come to the ER for evaluation.  She does have Implanon as well as morbid obesity.  Does not have any cough or fevers.  No known sick contacts.    Past Medical History:  Diagnosis Date  . Asthma    No family history on file. Past Surgical History:  Procedure Laterality Date  . CESAREAN SECTION     Patient Active Problem List   Diagnosis Date Noted  . Morbid obesity ([place]) 09/26/2018  . Elevated lipids 09/26/2018  . Health care maintenance 09/26/2018  . Discharge from ear, right 07/31/2018  . Elevated blood pressure reading 07/31/2018      Prior to Admission medications   Medication Sig Start Date End Date Taking? Authorizing Provider  Blood Pressure Monitoring (BLOOD PRESSURE MONITOR AUTOMAT) DEVI 1 each by Does not apply route 2 (two) times daily. 07/31/18   Iloabachie, Chioma E, NP  omeprazole (PRILOSEC) 40 MG capsule Take 1 capsule (40 mg total) by mouth daily. 10/26/18 10/26/19  Merlyn Lot, MD    Allergies Patient has no known allergies.    Social History Social History   Tobacco Use  . Smoking status: Current Every Day Smoker    Packs/day: 0.50    Types: Cigarettes  . Smokeless tobacco: Never Used  Substance Use Topics  . Alcohol use: Yes   . Drug use: No    Review of Systems Patient denies headaches, rhinorrhea, blurry vision, numbness, shortness of breath, chest pain, edema, cough, abdominal pain, nausea, vomiting, diarrhea, dysuria, fevers, rashes or hallucinations unless otherwise stated above in HPI. ____________________________________________   PHYSICAL EXAM:  VITAL SIGNS: Vitals:   10/26/18 1206 10/26/18 1336  BP: (!) 177/92 (!) 170/89  Pulse: 79 87  Resp: 18 16  Temp:  98.4 F (36.9 C)  SpO2: 100% 99%    Constitutional: Alert and oriented.  Eyes: Conjunctivae are normal.  Head: Atraumatic. Nose: No congestion/rhinnorhea. Mouth/Throat: Mucous membranes are moist.   Neck: No stridor. Painless ROM.  Cardiovascular: Normal rate, regular rhythm. Grossly normal heart sounds.  Good peripheral circulation. Respiratory: Normal respiratory effort.  No retractions. Lungs CTAB. Gastrointestinal: Soft and nontender. No distention. No abdominal bruits. No CVA tenderness. Genitourinary:  Musculoskeletal: No lower extremity tenderness nor edema.  No joint effusions. Neurologic:  Normal speech and language. No gross focal neurologic deficits are appreciated. No facial droop Skin:  Skin is warm, dry and intact. No rash noted. Psychiatric: Mood and affect are normal. Speech and behavior are normal.  ____________________________________________   LABS (all labs ordered are listed, but only abnormal results are displayed)  Results for orders placed or performed during the hospital encounter of 10/26/18 (from the past 24 hour(s))  Basic metabolic panel     Status:  Abnormal   Collection Time: 10/26/18 10:03 AM  Result Value Ref Range   Sodium 138 135 - 145 mmol/L   Potassium 4.0 3.5 - 5.1 mmol/L   Chloride 105 98 - 111 mmol/L   CO2 24 22 - 32 mmol/L   Glucose, Bld 104 (H) 70 - 99 mg/dL   BUN 21 (H) 6 - 20 mg/dL   Creatinine, Ser 1.61 0.44 - 1.00 mg/dL   Calcium 9.0 8.9 - 09.6 mg/dL   GFR calc non Af Amer >60 >60  mL/min   GFR calc Af Amer >60 >60 mL/min   Anion gap 9 5 - 15  CBC     Status: None   Collection Time: 10/26/18 10:03 AM  Result Value Ref Range   WBC 9.8 4.0 - 10.5 K/uL   RBC 4.29 3.87 - 5.11 MIL/uL   Hemoglobin 12.5 12.0 - 15.0 g/dL   HCT 04.5 40.9 - 81.1 %   MCV 89.7 80.0 - 100.0 fL   MCH 29.1 26.0 - 34.0 pg   MCHC 32.5 30.0 - 36.0 g/dL   RDW 91.4 78.2 - 95.6 %   Platelets 309 150 - 400 K/uL   nRBC 0.0 0.0 - 0.2 %  Troponin I (High Sensitivity)     Status: None   Collection Time: 10/26/18 10:03 AM  Result Value Ref Range   Troponin I (High Sensitivity) <2 <18 ng/L  Fibrin derivatives D-Dimer ([facility] only)     Status: None   Collection Time: 10/26/18 10:03 AM  Result Value Ref Range   Fibrin derivatives D-dimer ([facility]) 297.35 0.00 - 499.00 ng/mL (FEU)  SARS Coronavirus 2 by RT PCR (hospital order, performed in [facility] hospital lab) Nasopharyngeal Nasopharyngeal Swab     Status: None   Collection Time: 10/26/18 10:44 AM   Specimen: Nasopharyngeal Swab  Result Value Ref Range   SARS Coronavirus 2 NEGATIVE NEGATIVE  Troponin I (High Sensitivity)     Status: None   Collection Time: 10/26/18 12:26 PM  Result Value Ref Range   Troponin I (High Sensitivity) 3 <18 ng/L   ____________________________________________  EKG My review and personal interpretation at Time: 9:07   Indication: chest pain  Rate: 80  Rhythm: sinus Axis: normal Other: normal intervals, no stemi ____________________________________________  RADIOLOGY  I personally reviewed all radiographic images ordered to evaluate for the above acute complaints and reviewed radiology reports and findings.  These findings were personally discussed with the patient.  Please see medical record for radiology report.  ____________________________________________   PROCEDURES  Procedure(s) performed:  Procedures    Critical Care performed: no ____________________________________________   INITIAL IMPRESSION /  ASSESSMENT AND PLAN / ED COURSE  Pertinent labs & imaging results that were available during my care of the patient were reviewed by me and considered in my medical decision making (see chart for details).   DDX: ACS, pericarditis, esophagitis, boerhaaves, pe, dissection, pna, bronchitis, costochondritis  Karalynn Calandra Madura is a 33 y.o. who presents to the ED with symptoms as described above.  Patient well-appearing abuse afebrile without any hypoxia.  Mild hypertension.  EKG without any ischemia.  Initial troponin is negative.  She does have Implanon though she is low risk by Wells criteria will order d-dimer to further re-stratify.  Will test for COVID.  Does report a history of heartburn for I wonder if she is having some sort of esophageal spasm or reflux.  Lower suspicion for dissection or ACS.  Doubt infectious process.  Abdominal exam soft  and benign.  Clinical Course as of Oct 26 1438  Fri Oct 26, 2018  1312 Repeat troponin is nonischemic.  Patient remains hemodynamically stable in no acute distress.  Appropriate for outpatient follow-up.  Have discussed with the patient and available family all diagnostics and treatments performed thus far and all questions were answered to the best of my ability. The patient demonstrates understanding and agreement with plan.    [PR]    Clinical Course User Index [PR] Willy Eddyobinson, Marcella Charlson, MD    The patient was evaluated in Emergency Department today for the symptoms described in the history of present illness. He/she was evaluated in the context of the global COVID-19 pandemic, which necessitated consideration that the patient might be at risk for infection with the SARS-CoV-2 virus that causes COVID-19. Institutional protocols and algorithms that pertain to the evaluation of patients at risk for COVID-19 are in a state of rapid change based on information released by regulatory bodies including the CDC and federal and state organizations. These policies  and algorithms were followed during the patient's care in the ED.  As part of my medical decision making, I reviewed the following data within the electronic MEDICAL RECORD NUMBER Nursing notes reviewed and incorporated, Labs reviewed, notes from prior ED visits and  Controlled Substance Database   ____________________________________________   FINAL CLINICAL IMPRESSION(S) / ED DIAGNOSES  Final diagnoses:  Atypical chest pain      NEW MEDICATIONS STARTED DURING THIS VISIT:  Discharge Medication List as of 10/26/2018  1:29 PM    START taking these medications   Details  omeprazole (PRILOSEC) 40 MG capsule Take 1 capsule (40 mg total) by mouth daily., Starting Fri 10/26/2018, Until Sat 10/26/2019, Normal         Note:  This document was prepared using Dragon voice recognition software and may include unintentional dictation errors.    Willy Eddyobinson, Nohelia Valenza, MD 10/26/18 1440

## 2018-12-27 ENCOUNTER — Telehealth: Payer: Self-pay | Admitting: Pharmacy Technician

## 2018-12-27 NOTE — Telephone Encounter (Signed)
Have contacted patient numerous times.  Patient has failed to complete [facility] Attestation and [facility] Contract.  Mailed to patient and have not received.  Patient needs to complete eligibility before additional medications will be provided.  [facility]

## 2019-06-18 ENCOUNTER — Ambulatory Visit: Payer: Self-pay

## 2019-10-03 ENCOUNTER — Encounter: Payer: Self-pay | Admitting: Gerontology

## 2019-10-03 ENCOUNTER — Ambulatory Visit: Payer: Self-pay | Admitting: Gerontology

## 2019-10-03 ENCOUNTER — Other Ambulatory Visit: Payer: Self-pay

## 2019-10-03 VITALS — BP 138/91 | HR 91 | Ht 63.5 in | Wt 360.0 lb

## 2019-10-03 DIAGNOSIS — R030 Elevated blood-pressure reading, without diagnosis of hypertension: Secondary | ICD-10-CM

## 2019-10-03 DIAGNOSIS — L732 Hidradenitis suppurativa: Secondary | ICD-10-CM | POA: Insufficient documentation

## 2019-10-03 DIAGNOSIS — Z0000 Encounter for general adult medical examination without abnormal findings: Secondary | ICD-10-CM

## 2019-10-03 NOTE — Patient Instructions (Signed)

## 2019-10-03 NOTE — Progress Notes (Signed)
Established Patient Office Visit  Subjective:  Patient ID: Emma Hamilton, female    DOB: May 12, 1985  Age: 34 y.o. MRN: [id number]  CC:  Chief Complaint  Patient presents with  . Bleeding/Bruising    off and on from belly button  . skin sores    primarily on abd    HPI Emma Hamilton presents for c/o observing small amount of bright red blood when she wiped her umbilicus last week. She reports that it was the first time she noticed it. She denies injury, or any open area or pain to her umbilicus. Also she reports that she has a history of Hydradenitis Suppurativa and she has been experiencing non pruritic, papular breakout to her right lower abdomen, axilla, perineal area and gluteal cleft. She denies erythema, discharge, fever and chills. Her blood pressure was elevated during visit, but she reports that it's usually in the low 130's/80's at home. Overall, she states that she's doing well and offers no further complaint.  Past Medical History:  Diagnosis Date  . Asthma     Past Surgical History:  Procedure Laterality Date  . CESAREAN SECTION      No family history on file.  Social History   Socioeconomic History  . Marital status: Married    Spouse name: Not on file  . Number of children: Not on file  . Years of education: Not on file  . Highest education level: Not on file  Occupational History  . Not on file  Tobacco Use  . Smoking status: Former Smoker    Packs/day: 0.50    Types: Cigarettes  . Smokeless tobacco: Never Used  Vaping Use  . Vaping Use: Never used  Substance and Sexual Activity  . Alcohol use: Not Currently  . Drug use: No  . Sexual activity: Not on file  Other Topics Concern  . Not on file  Social History Narrative  . Not on file   Social Determinants of Health   Financial Resource Strain:   . Difficulty of Paying Living Expenses: Not on file  Food Insecurity:   . Worried About Charity fundraiser in the Last Year: Not on  file  . Ran Out of Food in the Last Year: Not on file  Transportation Needs:   . Lack of Transportation (Medical): Not on file  . Lack of Transportation (Non-Medical): Not on file  Physical Activity:   . Days of Exercise per Week: Not on file  . Minutes of Exercise per Session: Not on file  Stress:   . Feeling of Stress : Not on file  Social Connections:   . Frequency of Communication with Friends and Family: Not on file  . Frequency of Social Gatherings with Friends and Family: Not on file  . Attends Religious Services: Not on file  . Active Member of Clubs or Organizations: Not on file  . Attends Archivist Meetings: Not on file  . Marital Status: Not on file  Intimate Partner Violence:   . Fear of Current or Ex-Partner: Not on file  . Emotionally Abused: Not on file  . Physically Abused: Not on file  . Sexually Abused: Not on file    Outpatient Medications Prior to Visit  Medication Sig Dispense Refill  . FENUGREEK PO Take by mouth.    . ferrous sulfate 325 (65 FE) MG tablet Take 325 mg by mouth daily with breakfast.    . Multiple Vitamin (MULTIVITAMIN) tablet Take 1 tablet by mouth  daily.    . Turmeric (QC TUMERIC COMPLEX PO) Take by mouth.    . Blood Pressure Monitoring (BLOOD PRESSURE MONITOR AUTOMAT) DEVI 1 each by Does not apply route 2 (two) times daily. 1 kit 0  . omeprazole (PRILOSEC) 40 MG capsule Take 1 capsule (40 mg total) by mouth daily. 30 capsule 0   No facility-administered medications prior to visit.    No Known Allergies  ROS Review of Systems  Constitutional: Negative.   Eyes: Negative.   Respiratory: Negative.   Cardiovascular: Negative.   Skin:       History of hydradenitis  Neurological: Negative.       Objective:    Physical Exam HENT:     Head: Normocephalic and atraumatic.  Cardiovascular:     Rate and Rhythm: Normal rate and regular rhythm.     Pulses: Normal pulses.     Heart sounds: Normal heart sounds.  Pulmonary:      Effort: Pulmonary effort is normal.     Breath sounds: Normal breath sounds.  Skin:    Findings: Lesion (healing papular lesion to right lower abdominal fold, no erythema nor discharge.) present.  Neurological:     General: No focal deficit present.     Mental Status: She is alert and oriented to person, place, and time. Mental status is at baseline.  Psychiatric:        Mood and Affect: Mood normal.        Behavior: Behavior normal.        Thought Content: Thought content normal.        Judgment: Judgment normal.     BP (!) 138/91 (BP Location: Left Arm, Patient Position: Sitting, Cuff Size: Large)   Pulse 91   Ht 5' 3.5" (1.613 m)   Wt (!) 360 lb (163.3 kg)   SpO2 98%   BMI 62.77 kg/m  Wt Readings from Last 3 Encounters:  10/03/19 (!) 360 lb (163.3 kg)  10/26/18 (!) 335 lb 1.6 oz (152 kg)  09/26/18 (!) 334 lb 3.2 oz (151.6 kg)   She gained 25 pounds in 11 months, was encouraged to lose weight.  Health Maintenance Due  Topic Date Due  . Hepatitis C Screening  Never done  . COVID-19 Vaccine (1) Never done  . HIV Screening  Never done  . PAP SMEAR-Modifier  Never done  . TETANUS/TDAP  01/30/2019  . INFLUENZA VACCINE  Never done    There are no preventive care reminders to display for this patient.  Lab Results  Component Value Date   TSH 3.020 09/19/2018   Lab Results  Component Value Date   WBC 9.8 10/26/2018   HGB 12.5 10/26/2018   HCT 38.5 10/26/2018   MCV 89.7 10/26/2018   PLT 309 10/26/2018   Lab Results  Component Value Date   NA 138 10/26/2018   K 4.0 10/26/2018   CO2 24 10/26/2018   GLUCOSE 104 (H) 10/26/2018   BUN 21 (H) 10/26/2018   CREATININE 0.56 10/26/2018   BILITOT 0.3 09/19/2018   ALKPHOS 80 09/19/2018   AST 14 09/19/2018   ALT 19 09/19/2018   PROT 6.3 09/19/2018   ALBUMIN 4.1 09/19/2018   CALCIUM 9.0 10/26/2018   ANIONGAP 9 10/26/2018   Lab Results  Component Value Date   CHOL 171 09/19/2018   Lab Results  Component Value  Date   HDL 40 09/19/2018   Lab Results  Component Value Date   LDLCALC 113 (H) 09/19/2018   Lab  Results  Component Value Date   TRIG 98 09/19/2018   Lab Results  Component Value Date   CHOLHDL 4.3 09/19/2018   Lab Results  Component Value Date   HGBA1C 5.5 09/19/2018      Assessment & Plan:   1. Elevated blood pressure reading - Her blood pressure was elevated during visit, was advised to check and document her blood pressure every other day, bring log to follow up appointment. She's to continue on DASH diet and exercise as tolerated.  2. Morbid obesity ([place]) -Counseled the patient on lifestyle modifications including diet and exercise. -Counseled the importance of exercise, starting an exercise program of walking 30 minutes 3-5 times a week  3. Hydradenitis - She was advises to notify clinic for worsening symptoms and to complete [facility] financial application for - Ambulatory referral to Dermatology  4. Health care maintenance - Routine labs will be checked. - Comp Met (CMET); Future - CBC w/Diff; Future - Lipid panel; Future - HgB A1c; Future - Urinalysis; Future - TSH; Future - Ambulatory referral to Hematology / Oncology     Follow-up: Return in about 3 weeks (around 10/24/2019), or if symptoms worsen or fail to improve.    Daenerys Buttram Jerold Coombe, NP

## 2019-10-23 ENCOUNTER — Other Ambulatory Visit: Payer: Self-pay

## 2019-10-23 DIAGNOSIS — Z0000 Encounter for general adult medical examination without abnormal findings: Secondary | ICD-10-CM

## 2019-10-24 LAB — LIPID PANEL
Chol/HDL Ratio: 4 ratio (ref 0.0–4.4)
Cholesterol, Total: 181 mg/dL (ref 100–199)
HDL: 45 mg/dL (ref >39)
LDL Chol Calc (NIH): 114 mg/dL — ABNORMAL HIGH (ref 0–99)
Triglycerides: 122 mg/dL (ref 0–149)
VLDL Cholesterol Cal: 22 mg/dL (ref 5–40)

## 2019-10-24 LAB — CBC WITH DIFFERENTIAL/PLATELET
Basophils Absolute: 0 10*3/uL (ref 0.0–0.2)
Basos: 0 %
EOS (ABSOLUTE): 0.2 10*3/uL (ref 0.0–0.4)
Eos: 2 %
Hematocrit: 38.3 % (ref 34.0–46.6)
Hemoglobin: 12.9 g/dL (ref 11.1–15.9)
Immature Grans (Abs): 0 10*3/uL (ref 0.0–0.1)
Immature Granulocytes: 0 %
Lymphocytes Absolute: 2.8 10*3/uL (ref 0.7–3.1)
Lymphs: 30 %
MCH: 29.2 pg (ref 26.6–33.0)
MCHC: 33.7 g/dL (ref 31.5–35.7)
MCV: 87 fL (ref 79–97)
Monocytes Absolute: 0.5 10*3/uL (ref 0.1–0.9)
Monocytes: 5 %
Neutrophils Absolute: 5.9 10*3/uL (ref 1.4–7.0)
Neutrophils: 63 %
Platelets: 265 10*3/uL (ref 150–450)
RBC: 4.42 x10E6/uL (ref 3.77–5.28)
RDW: 12.8 % (ref 11.7–15.4)
WBC: 9.4 10*3/uL (ref 3.4–10.8)

## 2019-10-24 LAB — COMPREHENSIVE METABOLIC PANEL
ALT: 22 IU/L (ref 0–32)
AST: 15 IU/L (ref 0–40)
Albumin/Globulin Ratio: 1.6 (ref 1.2–2.2)
Albumin: 4.2 g/dL (ref 3.8–4.8)
Alkaline Phosphatase: 76 IU/L (ref 44–121)
BUN/Creatinine Ratio: 23 (ref 9–23)
BUN: 18 mg/dL (ref 6–20)
Bilirubin Total: 0.3 mg/dL (ref 0.0–1.2)
CO2: 22 mmol/L (ref 20–29)
Calcium: 8.9 mg/dL (ref 8.7–10.2)
Chloride: 104 mmol/L (ref 96–106)
Creatinine, Ser: 0.77 mg/dL (ref 0.57–1.00)
GFR calc Af Amer: 117 mL/min/{1.73_m2} (ref >59)
GFR calc non Af Amer: 101 mL/min/{1.73_m2} (ref >59)
Globulin, Total: 2.6 g/dL (ref 1.5–4.5)
Glucose: 94 mg/dL (ref 65–99)
Potassium: 4.4 mmol/L (ref 3.5–5.2)
Sodium: 140 mmol/L (ref 134–144)
Total Protein: 6.8 g/dL (ref 6.0–8.5)

## 2019-10-24 LAB — URINALYSIS
Bilirubin, UA: NEGATIVE
Glucose, UA: NEGATIVE
Ketones, UA: NEGATIVE
Leukocytes,UA: NEGATIVE
Nitrite, UA: NEGATIVE
Protein,UA: NEGATIVE
Specific Gravity, UA: 1.026 (ref 1.005–1.030)
Urobilinogen, Ur: 0.2 mg/dL (ref 0.2–1.0)
pH, UA: 6 (ref 5.0–7.5)

## 2019-10-24 LAB — HEMOGLOBIN A1C
Est. average glucose Bld gHb Est-mCnc: 123 mg/dL
Hgb A1c MFr Bld: 5.9 % — ABNORMAL HIGH (ref 4.8–5.6)

## 2019-10-24 LAB — TSH: TSH: 3.42 u[IU]/mL (ref 0.450–4.500)

## 2019-10-29 ENCOUNTER — Ambulatory Visit: Payer: Self-pay | Admitting: Gerontology

## 2019-11-07 ENCOUNTER — Ambulatory Visit: Payer: Self-pay | Admitting: Gerontology

## 2019-11-14 ENCOUNTER — Ambulatory Visit: Payer: Self-pay | Admitting: Gerontology

## 2019-11-14 ENCOUNTER — Encounter: Payer: Self-pay | Admitting: Gerontology

## 2019-11-14 ENCOUNTER — Other Ambulatory Visit: Payer: Self-pay

## 2019-11-14 ENCOUNTER — Other Ambulatory Visit: Payer: Self-pay | Admitting: Gerontology

## 2019-11-14 VITALS — BP 140/99 | HR 99 | Temp 98.0°F | Resp 18 | Wt 366.5 lb

## 2019-11-14 DIAGNOSIS — R600 Localized edema: Secondary | ICD-10-CM | POA: Insufficient documentation

## 2019-11-14 DIAGNOSIS — L732 Hidradenitis suppurativa: Secondary | ICD-10-CM

## 2019-11-14 DIAGNOSIS — R319 Hematuria, unspecified: Secondary | ICD-10-CM

## 2019-11-14 DIAGNOSIS — Z8709 Personal history of other diseases of the respiratory system: Secondary | ICD-10-CM

## 2019-11-14 DIAGNOSIS — R030 Elevated blood-pressure reading, without diagnosis of hypertension: Secondary | ICD-10-CM

## 2019-11-14 DIAGNOSIS — R7303 Prediabetes: Secondary | ICD-10-CM | POA: Insufficient documentation

## 2019-11-14 DIAGNOSIS — E785 Hyperlipidemia, unspecified: Secondary | ICD-10-CM

## 2019-11-14 MED ORDER — ALBUTEROL SULFATE HFA 108 (90 BASE) MCG/ACT IN AERS: 2.0000 | INHALATION_SPRAY | Freq: Four times a day (QID) | RESPIRATORY_TRACT | 2 refills | Status: DC | PRN

## 2019-11-14 MED ORDER — CHLORTHALIDONE 25 MG PO TABS: 12.5000 mg | ORAL_TABLET | Freq: Every day | ORAL | 0 refills | Status: DC

## 2019-11-14 NOTE — Patient Instructions (Addendum)
DASH Eating Plan DASH stands for "Dietary Approaches to Stop Hypertension." The DASH eating plan is a healthy eating plan that has been shown to reduce high blood pressure (hypertension). It may also reduce your risk for type 2 diabetes, heart disease, and stroke. The DASH eating plan may also help with weight loss. What are tips for following this plan?  General guidelines  Avoid eating more than 2,300 mg (milligrams) of salt (sodium) a day. If you have hypertension, you may need to reduce your sodium intake to 1,500 mg a day.  Limit alcohol intake to no more than 1 drink a day for nonpregnant women and 2 drinks a day for men. One drink equals 12 oz of beer, 5 oz of wine, or 1 oz of hard liquor.  Work with your health care provider to maintain a healthy body weight or to lose weight. Ask what an ideal weight is for you.  Get at least 30 minutes of exercise that causes your heart to beat faster (aerobic exercise) most days of the week. Activities may include walking, swimming, or biking.  Work with your health care provider or diet and nutrition specialist (dietitian) to adjust your eating plan to your individual calorie needs. Reading food labels   Check food labels for the amount of sodium per serving. Choose foods with less than 5 percent of the Daily Value of sodium. Generally, foods with less than 300 mg of sodium per serving fit into this eating plan.  To find whole grains, look for the word "whole" as the first word in the ingredient list. Shopping  Buy products labeled as "low-sodium" or "no salt added."  Buy fresh foods. Avoid canned foods and premade or frozen meals. Cooking  Avoid adding salt when cooking. Use salt-free seasonings or herbs instead of table salt or sea salt. Check with your health care provider or pharmacist before using salt substitutes.  Do not fry foods. Cook foods using healthy methods such as baking, boiling, grilling, and broiling instead.  Cook with  heart-healthy oils, such as olive, canola, soybean, or sunflower oil. Meal planning  Eat a balanced diet that includes: ? 5 or more servings of fruits and vegetables each day. At each meal, try to fill half of your plate with fruits and vegetables. ? Up to 6-8 servings of whole grains each day. ? Less than 6 oz of lean meat, poultry, or fish each day. A 3-oz serving of meat is about the same size as a deck of cards. One egg equals 1 oz. ? 2 servings of low-fat dairy each day. ? A serving of nuts, seeds, or beans 5 times each week. ? Heart-healthy fats. Healthy fats called Omega-3 fatty acids are found in foods such as flaxseeds and coldwater fish, like sardines, salmon, and mackerel.  Limit how much you eat of the following: ? Canned or prepackaged foods. ? Food that is high in trans fat, such as fried foods. ? Food that is high in saturated fat, such as fatty meat. ? Sweets, desserts, sugary drinks, and other foods with added sugar. ? Full-fat dairy products.  Do not salt foods before eating.  Try to eat at least 2 vegetarian meals each week.  Eat more home-cooked food and less restaurant, buffet, and fast food.  When eating at a restaurant, ask that your food be prepared with less salt or no salt, if possible. What foods are recommended? The items listed may not be a complete list. Talk with your dietitian about   what dietary choices are best for you. Grains Whole-grain or whole-wheat bread. Whole-grain or whole-wheat pasta. Brown rice. Oatmeal. Quinoa. Bulgur. Whole-grain and low-sodium cereals. Pita bread. Low-fat, low-sodium crackers. Whole-wheat flour tortillas. Vegetables Fresh or frozen vegetables (raw, steamed, roasted, or grilled). Low-sodium or reduced-sodium tomato and vegetable juice. Low-sodium or reduced-sodium tomato sauce and tomato paste. Low-sodium or reduced-sodium canned vegetables. Fruits All fresh, dried, or frozen fruit. Canned fruit in natural juice (without  added sugar). Meat and other protein foods Skinless chicken or turkey. Ground chicken or turkey. Pork with fat trimmed off. Fish and seafood. Egg whites. Dried beans, peas, or lentils. Unsalted nuts, nut butters, and seeds. Unsalted canned beans. Lean cuts of beef with fat trimmed off. Low-sodium, lean deli meat. Dairy Low-fat (1%) or fat-free (skim) milk. Fat-free, low-fat, or reduced-fat cheeses. Nonfat, low-sodium ricotta or cottage cheese. Low-fat or nonfat yogurt. Low-fat, low-sodium cheese. Fats and oils Soft margarine without trans fats. Vegetable oil. Low-fat, reduced-fat, or light mayonnaise and salad dressings (reduced-sodium). Canola, safflower, olive, soybean, and sunflower oils. Avocado. Seasoning and other foods Herbs. Spices. Seasoning mixes without salt. Unsalted popcorn and pretzels. Fat-free sweets. What foods are not recommended? The items listed may not be a complete list. Talk with your dietitian about what dietary choices are best for you. Grains Baked goods made with fat, such as croissants, muffins, or some breads. Dry pasta or rice meal packs. Vegetables Creamed or fried vegetables. Vegetables in a cheese sauce. Regular canned vegetables (not low-sodium or reduced-sodium). Regular canned tomato sauce and paste (not low-sodium or reduced-sodium). Regular tomato and vegetable juice (not low-sodium or reduced-sodium). Pickles. Olives. Fruits Canned fruit in a light or heavy syrup. Fried fruit. Fruit in cream or butter sauce. Meat and other protein foods Fatty cuts of meat. Ribs. Fried meat. Bacon. Sausage. Bologna and other processed lunch meats. Salami. Fatback. Hotdogs. Bratwurst. Salted nuts and seeds. Canned beans with added salt. Canned or smoked fish. Whole eggs or egg yolks. Chicken or turkey with skin. Dairy Whole or 2% milk, cream, and half-and-half. Whole or full-fat cream cheese. Whole-fat or sweetened yogurt. Full-fat cheese. Nondairy creamers. Whipped toppings.  Processed cheese and cheese spreads. Fats and oils Butter. Stick margarine. Lard. Shortening. Ghee. Bacon fat. Tropical oils, such as coconut, palm kernel, or palm oil. Seasoning and other foods Salted popcorn and pretzels. Onion salt, garlic salt, seasoned salt, table salt, and sea salt. Worcestershire sauce. Tartar sauce. Barbecue sauce. Teriyaki sauce. Soy sauce, including reduced-sodium. Steak sauce. Canned and packaged gravies. Fish sauce. Oyster sauce. Cocktail sauce. Horseradish that you find on the shelf. Ketchup. Mustard. Meat flavorings and tenderizers. Bouillon cubes. Hot sauce and Tabasco sauce. Premade or packaged marinades. Premade or packaged taco seasonings. Relishes. Regular salad dressings. Where to find more information:  National Heart, Lung, and Blood Institute: www.nhlbi.nih.gov  American Heart Association: www.heart.org Summary  The DASH eating plan is a healthy eating plan that has been shown to reduce high blood pressure (hypertension). It may also reduce your risk for type 2 diabetes, heart disease, and stroke.  With the DASH eating plan, you should limit salt (sodium) intake to 2,300 mg a day. If you have hypertension, you may need to reduce your sodium intake to 1,500 mg a day.  When on the DASH eating plan, aim to eat more fresh fruits and vegetables, whole grains, lean proteins, low-fat dairy, and heart-healthy fats.  Work with your health care provider or diet and nutrition specialist (dietitian) to adjust your eating plan to your   individual calorie needs. This information is not intended to replace advice given to you by your health care provider. Make sure you discuss any questions you have with your health care provider. Document Revised: 12/16/2016 Document Reviewed: 12/28/2015 Elsevier Patient Education  2020 Else[employer] Inc.  Prediabetes Eating Plan Prediabetes is a condition that causes blood sugar (glucose) levels to be higher than normal. This increases  the risk for developing diabetes. In order to prevent diabetes from developing, your health care provider may recommend a diet and other lifestyle changes to help you:  Control your blood glucose levels.  Improve your cholesterol levels.  Manage your blood pressure. Your health care provider may recommend working with a diet and nutrition specialist (dietitian) to make a meal plan that is best for you. What are tips for following this plan? Lifestyle  Set weight loss goals with the help of your health care team. It is recommended that most people with prediabetes lose 7% of their current body weight.  Exercise for at least 30 minutes at least 5 days a week.  Attend a support group or seek ongoing support from a mental health counselor.  Take over-the-counter and prescription medicines only as told by your health care provider. Reading food labels  Read food labels to check the amount of fat, salt (sodium), and sugar in prepackaged foods. Avoid foods that have: ? Saturated fats. ? Trans fats. ? Added sugars.  Avoid foods that have more than 300 milligrams (mg) of sodium per serving. Limit your daily sodium intake to less than 2,300 mg each day. Shopping  Avoid buying pre-made and processed foods. Cooking  Cook with olive oil. Do not use butter, lard, or ghee.  Bake, broil, grill, or boil foods. Avoid frying. Meal planning   Work with your dietitian to develop an eating plan that is right for you. This may include: ? Tracking how many calories you take in. Use a food diary, notebook, or mobile application to track what you eat at each meal. ? Using the glycemic index (GI) to plan your meals. The index tells you how quickly a food will raise your blood glucose. Choose low-GI foods. These foods take a longer time to raise blood glucose.  Consider following a Mediterranean diet. This diet includes: ? Several servings each day of fresh fruits and vegetables. ? Eating fish at  least twice a week. ? Several servings each day of whole grains, beans, nuts, and seeds. ? Using olive oil instead of other fats. ? Moderate alcohol consumption. ? Eating small amounts of red meat and whole-fat dairy.  If you have high blood pressure, you may need to limit your sodium intake or follow a diet such as the DASH eating plan. DASH is an eating plan that aims to lower high blood pressure. What foods are recommended? The items listed below may not be a complete list. Talk with your dietitian about what dietary choices are best for you. Grains Whole grains, such as whole-wheat or whole-grain breads, crackers, cereals, and pasta. Unsweetened oatmeal. Bulgur. Barley. Quinoa. Brown rice. Corn or whole-wheat flour tortillas or taco shells. Vegetables Lettuce. Spinach. Peas. Beets. Cauliflower. Cabbage. Broccoli. Carrots. Tomatoes. Squash. Eggplant. Herbs. Peppers. Onions. Cucumbers. Brussels sprouts. Fruits Berries. Bananas. Apples. Oranges. Grapes. Papaya. Mango. Pomegranate. Kiwi. Grapefruit. Cherries. Meats and other protein foods Seafood. Poultry without skin. Lean cuts of pork and beef. Tofu. Eggs. Nuts. Beans. Dairy Low-fat or fat-free dairy products, such as yogurt, cottage cheese, and cheese. Beverages Water. Tea. Coffee.   Sugar-free or diet soda. Seltzer water. Lowfat or no-fat milk. Milk alternatives, such as soy or almond milk. Fats and oils Olive oil. Canola oil. Sunflower oil. Grapeseed oil. Avocado. Walnuts. Sweets and desserts Sugar-free or low-fat pudding. Sugar-free or low-fat ice cream and other frozen treats. Seasoning and other foods Herbs. Sodium-free spices. Mustard. Relish. Low-fat, low-sugar ketchup. Low-fat, low-sugar barbecue sauce. Low-fat or fat-free mayonnaise. What foods are not recommended? The items listed below may not be a complete list. Talk with your dietitian about what dietary choices are best for you. Grains Refined white flour and flour  products, such as bread, pasta, snack foods, and cereals. Vegetables Canned vegetables. Frozen vegetables with butter or cream sauce. Fruits Fruits canned with syrup. Meats and other protein foods Fatty cuts of meat. Poultry with skin. Breaded or fried meat. Processed meats. Dairy Full-fat yogurt, cheese, or milk. Beverages Sweetened drinks, such as sweet iced tea and soda. Fats and oils Butter. Lard. Ghee. Sweets and desserts Baked goods, such as cake, cupcakes, pastries, cookies, and cheesecake. Seasoning and other foods Spice mixes with added salt. Ketchup. Barbecue sauce. Mayonnaise. Summary  To prevent diabetes from developing, you may need to make diet and other lifestyle changes to help control blood sugar, improve cholesterol levels, and manage your blood pressure.  Set weight loss goals with the help of your health care team. It is recommended that most people with prediabetes lose 7 percent of their current body weight.  Consider following a Mediterranean diet that includes plenty of fresh fruits and vegetables, whole grains, beans, nuts, seeds, fish, lean meat, low-fat dairy, and healthy oils. This information is not intended to replace advice given to you by your health care provider. Make sure you discuss any questions you have with your health care provider. Document Revised: 04/27/2018 Document Reviewed: 03/09/2016 Elsevier Patient Education  2020 [employer] Inc.  Calorie Counting for [employer] Calories are units of energy. Your body needs a certain amount of calories from food to keep you going throughout the day. When you eat more calories than your body needs, your body stores the extra calories as fat. When you eat fewer calories than your body needs, your body burns fat to get the energy it needs. Calorie counting means keeping track of how many calories you eat and drink each day. Calorie counting can be helpful if you need to lose weight. If you make sure to eat  fewer calories than your body needs, you should lose weight. Ask your health care provider what a healthy weight is for you. For calorie counting to work, you will need to eat the right number of calories in a day in order to lose a healthy amount of weight per week. A dietitian can help you determine how many calories you need in a day and will give you suggestions on how to reach your calorie goal.  A healthy amount of weight to lose per week is usually 1-2 lb (0.5-0.9 kg). This usually means that your daily calorie intake should be reduced by 500-750 calories.  Eating 1,200 - 1,500 calories per day can help most women lose weight.  Eating 1,500 - 1,800 calories per day can help most men lose weight. What is my plan? My goal is to have __________ calories per day. If I have this many calories per day, I should lose around __________ pounds per week. What do I need to know about calorie counting? In order to meet your daily calorie goal, you will need  to:  Find out how many calories are in each food you would like to eat. Try to do this before you eat.  Decide how much of the food you plan to eat.  Write down what you ate and how many calories it had. Doing this is called keeping a food log. To successfully lose weight, it is important to balance calorie counting with a healthy lifestyle that includes regular activity. Aim for 150 minutes of moderate exercise (such as walking) or 75 minutes of vigorous exercise (such as running) each week. Where do I find calorie information?  The number of calories in a food can be found on a Nutrition Facts label. If a food does not have a Nutrition Facts label, try to look up the calories online or ask your dietitian for help. Remember that calories are listed per serving. If you choose to have more than one serving of a food, you will have to multiply the calories per serving by the amount of servings you plan to eat. For example, the label on a package  of bread might say that a serving size is 1 slice and that there are 90 calories in a serving. If you eat 1 slice, you will have eaten 90 calories. If you eat 2 slices, you will have eaten 180 calories. How do I keep a food log? Immediately after each meal, record the following information in your food log:  What you ate. Don't forget to include toppings, sauces, and other extras on the food.  How much you ate. This can be measured in cups, ounces, or number of items.  How many calories each food and drink had.  The total number of calories in the meal. Keep your food log near you, such as in a small notebook in your pocket, or use a mobile app or website. Some programs will calculate calories for you and show you how many calories you have left for the day to meet your goal. What are some calorie counting tips?   Use your calories on foods and drinks that will fill you up and not leave you hungry: ? Some examples of foods that fill you up are nuts and nut butters, vegetables, lean proteins, and high-fiber foods like whole grains. High-fiber foods are foods with more than 5 g fiber per serving. ? Drinks such as sodas, specialty coffee drinks, alcohol, and juices have a lot of calories, yet do not fill you up.  Eat nutritious foods and avoid empty calories. Empty calories are calories you get from foods or beverages that do not have many vitamins or protein, such as candy, sweets, and soda. It is better to have a nutritious high-calorie food (such as an avocado) than a food with few nutrients (such as a bag of chips).  Know how many calories are in the foods you eat most often. This will help you calculate calorie counts faster.  Pay attention to calories in drinks. Low-calorie drinks include water and unsweetened drinks.  Pay attention to nutrition labels for "low fat" or "fat free" foods. These foods sometimes have the same amount of calories or more calories than the full fat versions.  They also often have added sugar, starch, or salt, to make up for flavor that was removed with the fat.  Find a way of tracking calories that works for you. Get creative. Try different apps or programs if writing down calories does not work for you. What are some portion control tips?  Know  how many calories are in a serving. This will help you know how many servings of a certain food you can have.  Use a measuring cup to measure serving sizes. You could also try weighing out portions on a kitchen scale. With time, you will be able to estimate serving sizes for some foods.  Take some time to put servings of different foods on your favorite plates, bowls, and cups so you know what a serving looks like.  Try not to eat straight from a bag or box. Doing this can lead to overeating. Put the amount you would like to eat in a cup or on a plate to make sure you are eating the right portion.  Use smaller plates, glasses, and bowls to prevent overeating.  Try not to multitask (for example, watch TV or use your computer) while eating. If it is time to eat, sit down at a table and enjoy your food. This will help you to know when you are full. It will also help you to be aware of what you are eating and how much you are eating. What are tips for following this plan? Reading food labels  Check the calorie count compared to the serving size. The serving size may be smaller than what you are used to eating.  Check the source of the calories. Make sure the food you are eating is high in vitamins and protein and low in saturated and trans fats. Shopping  Read nutrition labels while you shop. This will help you make healthy decisions before you decide to purchase your food.  Make a grocery list and stick to it. Cooking  Try to cook your favorite foods in a healthier way. For example, try baking instead of frying.  Use low-fat dairy products. Meal planning  Use more fruits and vegetables. Half of your  plate should be fruits and vegetables.  Include lean proteins like poultry and fish. How do I count calories when eating out?  Ask for smaller portion sizes.  Consider sharing an entree and sides instead of getting your own entree.  If you get your own entree, eat only half. Ask for a box at the beginning of your meal and put the rest of your entree in it so you are not tempted to eat it.  If calories are listed on the menu, choose the lower calorie options.  Choose dishes that include vegetables, fruits, whole grains, low-fat dairy products, and lean protein.  Choose items that are boiled, broiled, grilled, or steamed. Stay away from items that are buttered, battered, fried, or served with cream sauce. Items labeled "crispy" are usually fried, unless stated otherwise.  Choose water, low-fat milk, unsweetened iced tea, or other drinks without added sugar. If you want an alcoholic beverage, choose a lower calorie option such as a glass of wine or light beer.  Ask for dressings, sauces, and syrups on the side. These are usually high in calories, so you should limit the amount you eat.  If you want a salad, choose a garden salad and ask for grilled meats. Avoid extra toppings like bacon, cheese, or fried items. Ask for the dressing on the side, or ask for olive oil and vinegar or lemon to use as dressing.  Estimate how many servings of a food you are given. For example, a serving of cooked rice is  cup or about the size of half a baseball. Knowing serving sizes will help you be aware of how much food you  are eating at restaurants. The list below tells you how big or small some common portion sizes are based on everyday objects: ? 1 oz--4 stacked dice. ? 3 oz--1 deck of cards. ? 1 tsp--1 die. ? 1 Tbsp-- a ping-pong ball. ? 2 Tbsp--1 ping-pong ball. ?  cup-- baseball. ? 1 cup--1 baseball. Summary  Calorie counting means keeping track of how many calories you eat and drink each day. If  you eat fewer calories than your body needs, you should lose weight.  A healthy amount of weight to lose per week is usually 1-2 lb (0.5-0.9 kg). This usually means reducing your daily calorie intake by 500-750 calories.  The number of calories in a food can be found on a Nutrition Facts label. If a food does not have a Nutrition Facts label, try to look up the calories online or ask your dietitian for help.  Use your calories on foods and drinks that will fill you up, and not on foods and drinks that will leave you hungry.  Use smaller plates, glasses, and bowls to prevent overeating. This information is not intended to replace advice given to you by your health care provider. Make sure you discuss any questions you have with your health care provider. Document Revised: 09/22/2017 Document Reviewed: 12/04/2015 Elsevier Patient Education  2020 Else[employer] Inc.  Fat and Cholesterol Restricted Eating Plan Getting too much fat and cholesterol in your diet may cause health problems. Choosing the right foods helps keep your fat and cholesterol at normal levels. This can keep you from getting certain diseases. Your doctor may recommend an eating plan that includes:  Total fat: ______% or less of total calories a day.  Saturated fat: ______% or less of total calories a day.  Cholesterol: less than _________mg a day.  Fiber: ______g a day. What are tips for following this plan? Meal planning  At meals, divide your plate into four equal parts: ? Fill one-half of your plate with vegetables and green salads. ? Fill one-fourth of your plate with whole grains. ? Fill one-fourth of your plate with low-fat (lean) protein foods.  Eat fish that is high in omega-3 fats at least two times a week. This includes mackerel, tuna, sardines, and salmon.  Eat foods that are high in fiber, such as whole grains, beans, apples, broccoli, carrots, peas, and barley. General tips   Work with your doctor to lose  weight if you need to.  Avoid: ? Foods with added sugar. ? Fried foods. ? Foods with partially hydrogenated oils.  Limit alcohol intake to no more than 1 drink a day for nonpregnant women and 2 drinks a day for men. One drink equals 12 oz of beer, 5 oz of wine, or 1 oz of hard liquor. Reading food labels  Check food labels for: ? Trans fats. ? Partially hydrogenated oils. ? Saturated fat (g) in each serving. ? Cholesterol (mg) in each serving. ? Fiber (g) in each serving.  Choose foods with healthy fats, such as: ? Monounsaturated fats. ? Polyunsaturated fats. ? Omega-3 fats.  Choose grain products that have whole grains. Look for the word "whole" as the first word in the ingredient list. Cooking  Cook foods using low-fat methods. These include baking, boiling, grilling, and broiling.  Eat more home-cooked foods. Eat at restaurants and buffets less often.  Avoid cooking using saturated fats, such as butter, cream, palm oil, palm kernel oil, and coconut oil. Recommended foods  Fruits  All fresh, canned (in  natural juice), or frozen fruits. Vegetables  Fresh or frozen vegetables (raw, steamed, roasted, or grilled). Green salads. Grains  Whole grains, such as whole wheat or whole grain breads, crackers, cereals, and pasta. Unsweetened oatmeal, bulgur, barley, quinoa, or brown rice. Corn or whole wheat flour tortillas. Meats and other protein foods  Ground beef (85% or leaner), grass-fed beef, or beef trimmed of fat. Skinless chicken or [place]. Ground chicken or [place]. Pork trimmed of fat. All fish and seafood. Egg whites. Dried beans, peas, or lentils. Unsalted nuts or seeds. Unsalted canned beans. Nut butters without added sugar or oil. Dairy  Low-fat or nonfat dairy products, such as skim or 1% milk, 2% or reduced-fat cheeses, low-fat and fat-free ricotta or cottage cheese, or plain low-fat and nonfat yogurt. Fats and oils  Tub margarine without trans fats. Light or  reduced-fat mayonnaise and salad dressings. Avocado. Olive, canola, sesame, or safflower oils. The items listed above may not be a complete list of foods and beverages you can eat. Contact a dietitian for more information. Foods to avoid Fruits  Canned fruit in heavy syrup. Fruit in cream or butter sauce. Fried fruit. Vegetables  Vegetables cooked in cheese, cream, or butter sauce. Fried vegetables. Grains  White bread. White pasta. White rice. Cornbread. Bagels, pastries, and croissants. Crackers and snack foods that contain trans fat and hydrogenated oils. Meats and other protein foods  Fatty cuts of meat. Ribs, chicken wings, bacon, sausage, bologna, salami, chitterlings, fatback, hot dogs, bratwurst, and packaged lunch meats. Liver and organ meats. Whole eggs and egg yolks. Chicken and [place] with skin. Fried meat. Dairy  Whole or 2% milk, cream, half-and-half, and cream cheese. Whole milk cheeses. Whole-fat or sweetened yogurt. Full-fat cheeses. Nondairy creamers and whipped toppings. Processed cheese, cheese spreads, and cheese curds. Beverages  Alcohol. Sugar-sweetened drinks such as sodas, lemonade, and fruit drinks. Fats and oils  Butter, stick margarine, lard, shortening, ghee, or bacon fat. Coconut, palm kernel, and palm oils. Sweets and desserts  Corn syrup, sugars, honey, and molasses. Candy. Jam and jelly. Syrup. Sweetened cereals. Cookies, pies, cakes, donuts, muffins, and ice cream. The items listed above may not be a complete list of foods and beverages you should avoid. Contact a dietitian for more information. Summary  Choosing the right foods helps keep your fat and cholesterol at normal levels. This can keep you from getting certain diseases.  At meals, fill one-half of your plate with vegetables and green salads.  Eat high-fiber foods, like whole grains, beans, apples, carrots, peas, and barley.  Limit added sugar, saturated fats, alcohol, and fried  foods. This information is not intended to replace advice given to you by your health care provider. Make sure you discuss any questions you have with your health care provider. Document Revised: 09/06/2017 Document Reviewed: 09/20/2016 Elsevier Patient Education  2020 [employer].

## 2019-11-14 NOTE — Addendum Note (Signed)
Addended by: Eulogio Bear E on: 11/14/2019 04:34 PM   Modules accepted: Orders

## 2019-11-14 NOTE — Addendum Note (Signed)
Addended by: Eulogio Bear E on: 11/14/2019 04:31 PM   Modules accepted: Orders

## 2019-11-14 NOTE — Addendum Note (Signed)
Addended by: Eulogio Bear E on: 11/14/2019 04:28 PM   Modules accepted: Orders

## 2019-11-14 NOTE — Progress Notes (Signed)
Established Patient Office Visit  Subjective:  Patient ID: Emma Hamilton, female    DOB: May 19, 1985  Age: 34 y.o. MRN: [id number]  CC: No chief complaint on file.   HPI Emma Hamilton is a 35 y.o female who presents for follow up of elevated high blood pressure, obesity, hydradenitis, and lab review. She continues to work on positive life style modifications and verbalized checking her blood pressure every other day. She denies noticing anymore bleeding around her umbilicus. She also denies any new skin eruption to her armpits and right lower abdominal folds given her history of hydradenitis. In addition, she also complained of  experiencing intermittent knee tenderness with ambulation that is chronic. During this visit,she presented her blood pressure logs. Her blood pressure ranged from 187/101 to 145/63mHg. Her recent lab done 10/23/2019 showed HgbA1C of 5.9%, LDL cholesterol of 114 mg/dL. Her urine showed +3 RBC. She verbalized stopping Aleve due to increased GI disturbances. She denies smoking or alcohol use. Overall, she states that she is doing well and offers no additional complaint.     Past Medical History:  Diagnosis Date  . Asthma     Past Surgical History:  Procedure Laterality Date  . CESAREAN SECTION      No family history on file.  Social History   Socioeconomic History  . Marital status: Married    Spouse name: Not on file  . Number of children: Not on file  . Years of education: Not on file  . Highest education level: Not on file  Occupational History  . Not on file  Tobacco Use  . Smoking status: Former Smoker    Packs/day: 0.50    Types: Cigarettes  . Smokeless tobacco: Never Used  Vaping Use  . Vaping Use: Never used  Substance and Sexual Activity  . Alcohol use: Not Currently  . Drug use: No  . Sexual activity: Not on file  Other Topics Concern  . Not on file  Social History Narrative  . Not on file   Social Determinants of  Health   Financial Resource Strain:   . Difficulty of Paying Living Expenses: Not on file  Food Insecurity:   . Worried About RCharity fundraiserin the Last Year: Not on file  . Ran Out of Food in the Last Year: Not on file  Transportation Needs:   . Lack of Transportation (Medical): Not on file  . Lack of Transportation (Non-Medical): Not on file  Physical Activity:   . Days of Exercise per Week: Not on file  . Minutes of Exercise per Session: Not on file  Stress:   . Feeling of Stress : Not on file  Social Connections:   . Frequency of Communication with Friends and Family: Not on file  . Frequency of Social Gatherings with Friends and Family: Not on file  . Attends Religious Services: Not on file  . Active Member of Clubs or Organizations: Not on file  . Attends CArchivistMeetings: Not on file  . Marital Status: Not on file  Intimate Partner Violence:   . Fear of Current or Ex-Partner: Not on file  . Emotionally Abused: Not on file  . Physically Abused: Not on file  . Sexually Abused: Not on file    Outpatient Medications Prior to Visit  Medication Sig Dispense Refill  . Blood Pressure Monitoring (BLOOD PRESSURE MONITOR AUTOMAT) DEVI 1 each by Does not apply route 2 (two) times daily. 1 kit 0  .  FENUGREEK PO Take by mouth.    . ferrous sulfate 325 (65 FE) MG tablet Take 325 mg by mouth daily with breakfast. (Patient not taking: Reported on 11/14/2019)    . Multiple Vitamin (MULTIVITAMIN) tablet Take 1 tablet by mouth daily. (Patient not taking: Reported on 11/14/2019)    . Turmeric (QC TUMERIC COMPLEX PO) Take by mouth. (Patient not taking: Reported on 11/14/2019)     No facility-administered medications prior to visit.    No Known Allergies  ROS Review of Systems  Constitutional: Negative.   HENT: Negative.   Eyes: Negative.   Respiratory: Negative.   Gastrointestinal: Negative.   Endocrine: Negative.   Genitourinary: Negative.   Musculoskeletal:  Positive for arthralgias (.Hx of chronic arthritis ).  Neurological: Negative.   Psychiatric/Behavioral: Negative.       Objective:    Physical Exam Constitutional:      Appearance: She is obese.  HENT:     Head: Normocephalic and atraumatic.     Nose: Nose normal.  Cardiovascular:     Rate and Rhythm: Normal rate and regular rhythm.     Pulses: Normal pulses.     Heart sounds: Normal heart sounds.  Pulmonary:     Effort: Pulmonary effort is normal.     Breath sounds: Normal breath sounds.  Abdominal:     General: There is distension.  Musculoskeletal:        General: Swelling present.     Cervical back: Normal range of motion.     Right lower leg: Edema (.+1 ) present.     Left lower leg: Edema (.+1 ) present.  Neurological:     General: No focal deficit present.     Mental Status: She is alert.  Psychiatric:        Mood and Affect: Mood normal.        Behavior: Behavior normal.        Thought Content: Thought content normal.        Judgment: Judgment normal.     BP (!) 140/99 (BP Location: Left Arm, Patient Position: Sitting, Cuff Size: Large)   Pulse 99   Temp 98 F (36.7 C) (Oral)   Resp 18   Wt (!) 366 lb 8 oz (166.2 kg)   SpO2 96%   BMI 63.90 kg/m  Wt Readings from Last 3 Encounters:  11/14/19 (!) 366 lb 8 oz (166.2 kg)  10/03/19 (!) 360 lb (163.3 kg)  10/26/18 (!) 335 lb 1.6 oz (152 kg)   She was advised to loose weight   Health Maintenance Due  Topic Date Due  . Hepatitis C Screening  Never done  . COVID-19 Vaccine (1) Never done  . HIV Screening  Never done  . PAP SMEAR-Modifier  Never done  . TETANUS/TDAP  01/30/2019  . INFLUENZA VACCINE  Never done    There are no preventive care reminders to display for this patient.  Lab Results  Component Value Date   TSH 3.420 10/23/2019   Lab Results  Component Value Date   WBC 9.4 10/23/2019   HGB 12.9 10/23/2019   HCT 38.3 10/23/2019   MCV 87 10/23/2019   PLT 265 10/23/2019   Lab Results   Component Value Date   NA 140 10/23/2019   K 4.4 10/23/2019   CO2 22 10/23/2019   GLUCOSE 94 10/23/2019   BUN 18 10/23/2019   CREATININE 0.77 10/23/2019   BILITOT 0.3 10/23/2019   ALKPHOS 76 10/23/2019   AST 15 10/23/2019  ALT 22 10/23/2019   PROT 6.8 10/23/2019   ALBUMIN 4.2 10/23/2019   CALCIUM 8.9 10/23/2019   ANIONGAP 9 10/26/2018   Lab Results  Component Value Date   CHOL 181 10/23/2019   Lab Results  Component Value Date   HDL 45 10/23/2019   Lab Results  Component Value Date   LDLCALC 114 (H) 10/23/2019   Lab Results  Component Value Date   TRIG 122 10/23/2019   Lab Results  Component Value Date   CHOLHDL 4.0 10/23/2019   Lab Results  Component Value Date   HGBA1C 5.9 (H) 10/23/2019      Assessment & Plan:   1. Hydradenitis She continues to report experiencing intermittent skin rashes around her groin and armpits. She would be referred to dermatology.  - Ambulatory referral to Dermatology  2. Elevated blood pressure reading Her blood pressure is not under control. She will be started on Chlorthalidone and was educated on side effects of medication. She was advised to contact clinic for worsening side effects. - chlorthalidone (HYGROTON) 25 MG tablet; Take 0.5 tablets (12.5 mg total) by mouth daily.  Dispense: 30 tablet; Refill: 0 She was advised to check her blood pressure BID, and bring log to next appointment.  3. Morbid obesity ([place]) She was strongly advise to continue her weight loss regimen.  She was educated on food choices and portion control. She was advised to continue low salt, DASH diet and exercise as tolerated   4. Hematuria, unspecified type Her lab showed a RBC, UA +3, her urine will be rechecked. - Urinalysis; Future  5. Prediabetes Her HgbA1C was 5.9%, her goal should be less than 5.7%. She states she would continue working on life style modifications to decrease HgbA1C.  6. Elevated lipids She was advised to decrease intake  of fried, fatty foods, and exercise as tolerated.   7. History of asthma She will start Albuterol PRN as needed. She was educated on the side effects of medication and was advised to contact clinic. - albuterol (VENTOLIN HFA) 108 (90 Base) MCG/ACT inhaler; Inhale 2 puffs into the lungs every 6 (six) hours as needed for wheezing or shortness of breath.  Dispense: 6.7 g; Refill: 2  8. Edema of both lower extremities She was advised to wear compression stocking and elevated her lower extremities when in a seated position She was also educated to perform daily foot inspection.   Follow-up: Follow up in 1 month or if symptoms worsen of fail to improve.   Carney Corners, RN

## 2019-11-15 LAB — URINALYSIS
Bilirubin, UA: NEGATIVE
Glucose, UA: NEGATIVE
Ketones, UA: NEGATIVE
Leukocytes,UA: NEGATIVE
Nitrite, UA: NEGATIVE
Protein,UA: NEGATIVE
RBC, UA: NEGATIVE
Specific Gravity, UA: 1.027 (ref 1.005–1.030)
Urobilinogen, Ur: 0.2 mg/dL (ref 0.2–1.0)
pH, UA: 5.5 (ref 5.0–7.5)

## 2019-12-19 ENCOUNTER — Ambulatory Visit: Payer: Self-pay | Admitting: Gerontology

## 2019-12-19 ENCOUNTER — Other Ambulatory Visit: Payer: Self-pay | Admitting: Gerontology

## 2019-12-19 ENCOUNTER — Encounter: Payer: Self-pay | Admitting: Gerontology

## 2019-12-19 ENCOUNTER — Other Ambulatory Visit: Payer: Self-pay

## 2019-12-19 VITALS — BP 143/97 | HR 94 | Temp 97.9°F | Resp 18 | Ht 63.0 in | Wt 363.6 lb

## 2019-12-19 DIAGNOSIS — R030 Elevated blood-pressure reading, without diagnosis of hypertension: Secondary | ICD-10-CM

## 2019-12-19 DIAGNOSIS — Z309 Encounter for contraceptive management, unspecified: Secondary | ICD-10-CM

## 2019-12-19 DIAGNOSIS — Z0000 Encounter for general adult medical examination without abnormal findings: Secondary | ICD-10-CM

## 2019-12-19 MED ORDER — CHLORTHALIDONE 25 MG PO TABS: 12.5000 mg | ORAL_TABLET | Freq: Every day | ORAL | 2 refills | Status: DC

## 2019-12-19 NOTE — Progress Notes (Signed)
Established Patient Office Visit  Subjective:  Patient ID: Emma Hamilton, female    DOB: 05-21-1985  Age: 34 y.o. MRN: [id number]  CC: No chief complaint on file.   HPI Emma Hamilton is a 34 y.o who presents for follow up of hypertension, obesity and hydradenitis. She verbalized being compliant with her medication regimen. She reports not checking her blood pressure because she misplaced her blood pressure meter. She continues to work on positive lifestyle modification and states that she is working on maintaining a healthy eating habit. She reports no new development with her hydradenitis. She currently has an implanted contraceptive (Implanon) to her left upper arm which has been in place for about 8 years. She verbalized that it would like it removed. She states that her mood is good. Overall, she states that she is doing well and offers no further complaint.   Past Medical History:  Diagnosis Date  . Asthma     Past Surgical History:  Procedure Laterality Date  . CESAREAN SECTION      No family history on file.  Social History   Socioeconomic History  . Marital status: Married    Spouse name: Not on file  . Number of children: Not on file  . Years of education: Not on file  . Highest education level: Not on file  Occupational History  . Not on file  Tobacco Use  . Smoking status: Former Smoker    Packs/day: 0.50    Types: Cigarettes  . Smokeless tobacco: Never Used  Vaping Use  . Vaping Use: Never used  Substance and Sexual Activity  . Alcohol use: Not Currently  . Drug use: No  . Sexual activity: Not on file  Other Topics Concern  . Not on file  Social History Narrative  . Not on file   Social Determinants of Health   Financial Resource Strain:   . Difficulty of Paying Living Expenses: Not on file  Food Insecurity:   . Worried About Charity fundraiser in the Last Year: Not on file  . Ran Out of Food in the Last Year: Not on file   Transportation Needs:   . Lack of Transportation (Medical): Not on file  . Lack of Transportation (Non-Medical): Not on file  Physical Activity:   . Days of Exercise per Week: Not on file  . Minutes of Exercise per Session: Not on file  Stress:   . Feeling of Stress : Not on file  Social Connections:   . Frequency of Communication with Friends and Family: Not on file  . Frequency of Social Gatherings with Friends and Family: Not on file  . Attends Religious Services: Not on file  . Active Member of Clubs or Organizations: Not on file  . Attends Archivist Meetings: Not on file  . Marital Status: Not on file  Intimate Partner Violence:   . Fear of Current or Ex-Partner: Not on file  . Emotionally Abused: Not on file  . Physically Abused: Not on file  . Sexually Abused: Not on file    Outpatient Medications Prior to Visit  Medication Sig Dispense Refill  . albuterol (VENTOLIN HFA) 108 (90 Base) MCG/ACT inhaler Inhale 2 puffs into the lungs every 6 (six) hours as needed for wheezing or shortness of breath. 6.7 g 2  . chlorthalidone (HYGROTON) 25 MG tablet Take 0.5 tablets (12.5 mg total) by mouth daily. 30 tablet 0  . Blood Pressure Monitoring (BLOOD PRESSURE MONITOR AUTOMAT)  DEVI 1 each by Does not apply route 2 (two) times daily. 1 kit 0  . FENUGREEK PO Take by mouth. (Patient not taking: Reported on 12/19/2019)    . ferrous sulfate 325 (65 FE) MG tablet Take 325 mg by mouth daily with breakfast. (Patient not taking: Reported on 11/14/2019)     No facility-administered medications prior to visit.    No Known Allergies  ROS Review of Systems  Constitutional: Negative.   HENT: Negative.   Eyes: Negative.   Respiratory: Positive for wheezing (.Hx or Asthma, currently takes albuterol prn.).   Cardiovascular: Negative.   Gastrointestinal: Negative.   Endocrine: Negative.   Genitourinary: Negative.   Musculoskeletal: Negative.   Skin: Negative.   Neurological:  Negative.   Hematological: Negative.   Psychiatric/Behavioral: Negative.       Objective:    Physical Exam Constitutional:      Appearance: She is obese.  HENT:     Head: Normocephalic.     Nose: Nose normal.  Cardiovascular:     Rate and Rhythm: Normal rate and regular rhythm.     Pulses: Normal pulses.     Heart sounds: Normal heart sounds.  Neurological:     Mental Status: She is alert.     BP (!) 143/97 (BP Location: Right Arm, Patient Position: Sitting, Cuff Size: Large)   Pulse 94   Temp 97.9 F (36.6 C) (Oral)   Resp 18   Ht _0  (1.6 m)   Wt (!) 363 lb 9.6 oz (164.9 kg)   SpO2 98%   BMI 64.41 kg/m  Wt Readings from Last 3 Encounters:  12/19/19 (!) 363 lb 9.6 oz (164.9 kg)  11/14/19 (!) 366 lb 8 oz (166.2 kg)  10/03/19 (!) 360 lb (163.3 kg)   Was advised to continue on her current weight regimen   Health Maintenance Due  Topic Date Due  . Hepatitis C Screening  Never done  . COVID-19 Vaccine (1) Never done  . HIV Screening  Never done  . PAP SMEAR-Modifier  Never done  . TETANUS/TDAP  01/30/2019  . INFLUENZA VACCINE  Never done    There are no preventive care reminders to display for this patient.  Lab Results  Component Value Date   TSH 3.420 10/23/2019   Lab Results  Component Value Date   WBC 9.4 10/23/2019   HGB 12.9 10/23/2019   HCT 38.3 10/23/2019   MCV 87 10/23/2019   PLT 265 10/23/2019   Lab Results  Component Value Date   NA 140 10/23/2019   K 4.4 10/23/2019   CO2 22 10/23/2019   GLUCOSE 94 10/23/2019   BUN 18 10/23/2019   CREATININE 0.77 10/23/2019   BILITOT 0.3 10/23/2019   ALKPHOS 76 10/23/2019   AST 15 10/23/2019   ALT 22 10/23/2019   PROT 6.8 10/23/2019   ALBUMIN 4.2 10/23/2019   CALCIUM 8.9 10/23/2019   ANIONGAP 9 10/26/2018   Lab Results  Component Value Date   CHOL 181 10/23/2019   Lab Results  Component Value Date   HDL 45 10/23/2019   Lab Results  Component Value Date   LDLCALC 114 (H) 10/23/2019    Lab Results  Component Value Date   TRIG 122 10/23/2019   Lab Results  Component Value Date   CHOLHDL 4.0 10/23/2019   Lab Results  Component Value Date   HGBA1C 5.9 (H) 10/23/2019      Assessment & Plan:   1. Elevated blood pressure reading Her blood  pressure is not under control. Her goal should be <140/90 mmHg. She will continue current treatment regimen  - chlorthalidone (HYGROTON) 25 MG tablet; Take 0.5 tablets (12.5 mg total) by mouth daily.  Dispense: 30 tablet; Refill: 2 She was advised to continue low salt Dash diet and exercise as tolerated  2. Presence of contraceptive device She verbalized needing to remove her implanted contraceptive device. - Ambulatory referral to General Surgery  3. Morbid obesity ([place]) She lost 3 lbs. Since her last visit. She was strongly advised to continue with her weight loss regimen She was encouraged to continue positive lifestyle modification and increase water intake. She was encouraged to continue low fat/cholesterol diet and exercise as tolerated   4. Health care maintenance  She verbalized intent and consent on receiving the Influenza vaccine.  - Flu Vaccine QUAD 6+ mos PF IM (Fluarix Quad PF) Flu Vaccine was administered. She was provided information about the Vaccine and was informed to contact clinic    Follow-up: In 3 months or sooner if symptom worsen or fail to improve   Carney Corners, RN

## 2019-12-19 NOTE — Patient Instructions (Signed)
Calorie Counting for Weight Loss Calories are units of energy. Your body needs a certain amount of calories from food to keep you going throughout the day. When you eat more calories than your body needs, your body stores the extra calories as fat. When you eat fewer calories than your body needs, your body burns fat to get the energy it needs. Calorie counting means keeping track of how many calories you eat and drink each day. Calorie counting can be helpful if you need to lose weight. If you make sure to eat fewer calories than your body needs, you should lose weight. Ask your health care provider what a healthy weight is for you. For calorie counting to work, you will need to eat the right number of calories in a day in order to lose a healthy amount of weight per week. A dietitian can help you determine how many calories you need in a day and will give you suggestions on how to reach your calorie goal.  A healthy amount of weight to lose per week is usually 1-2 lb (0.5-0.9 kg). This usually means that your daily calorie intake should be reduced by 500-750 calories.  Eating 1,200 - 1,500 calories per day can help most women lose weight.  Eating 1,500 - 1,800 calories per day can help most men lose weight. What is my plan? My goal is to have __________ calories per day. If I have this many calories per day, I should lose around __________ pounds per week. What do I need to know about calorie counting? In order to meet your daily calorie goal, you will need to:  Find out how many calories are in each food you would like to eat. Try to do this before you eat.  Decide how much of the food you plan to eat.  Write down what you ate and how many calories it had. Doing this is called keeping a food log. To successfully lose weight, it is important to balance calorie counting with a healthy lifestyle that includes regular activity. Aim for 150 minutes of moderate exercise (such as walking) or 75  minutes of vigorous exercise (such as running) each week. Where do I find calorie information?  The number of calories in a food can be found on a Nutrition Facts label. If a food does not have a Nutrition Facts label, try to look up the calories online or ask your dietitian for help. Remember that calories are listed per serving. If you choose to have more than one serving of a food, you will have to multiply the calories per serving by the amount of servings you plan to eat. For example, the label on a package of bread might say that a serving size is 1 slice and that there are 90 calories in a serving. If you eat 1 slice, you will have eaten 90 calories. If you eat 2 slices, you will have eaten 180 calories. How do I keep a food log? Immediately after each meal, record the following information in your food log:  What you ate. Don't forget to include toppings, sauces, and other extras on the food.  How much you ate. This can be measured in cups, ounces, or number of items.  How many calories each food and drink had.  The total number of calories in the meal. Keep your food log near you, such as in a small notebook in your pocket, or use a mobile app or website. Some programs will calculate   calories for you and show you how many calories you have left for the day to meet your goal. What are some calorie counting tips?   Use your calories on foods and drinks that will fill you up and not leave you hungry: ? Some examples of foods that fill you up are nuts and nut butters, vegetables, lean proteins, and high-fiber foods like whole grains. High-fiber foods are foods with more than 5 g fiber per serving. ? Drinks such as sodas, specialty coffee drinks, alcohol, and juices have a lot of calories, yet do not fill you up.  Eat nutritious foods and avoid empty calories. Empty calories are calories you get from foods or beverages that do not have many vitamins or protein, such as candy, sweets, and  soda. It is better to have a nutritious high-calorie food (such as an avocado) than a food with few nutrients (such as a bag of chips).  Know how many calories are in the foods you eat most often. This will help you calculate calorie counts faster.  Pay attention to calories in drinks. Low-calorie drinks include water and unsweetened drinks.  Pay attention to nutrition labels for "low fat" or "fat free" foods. These foods sometimes have the same amount of calories or more calories than the full fat versions. They also often have added sugar, starch, or salt, to make up for flavor that was removed with the fat.  Find a way of tracking calories that works for you. Get creative. Try different apps or programs if writing down calories does not work for you. What are some portion control tips?  Know how many calories are in a serving. This will help you know how many servings of a certain food you can have.  Use a measuring cup to measure serving sizes. You could also try weighing out portions on a kitchen scale. With time, you will be able to estimate serving sizes for some foods.  Take some time to put servings of different foods on your favorite plates, bowls, and cups so you know what a serving looks like.  Try not to eat straight from a bag or box. Doing this can lead to overeating. Put the amount you would like to eat in a cup or on a plate to make sure you are eating the right portion.  Use smaller plates, glasses, and bowls to prevent overeating.  Try not to multitask (for example, watch TV or use your computer) while eating. If it is time to eat, sit down at a table and enjoy your food. This will help you to know when you are full. It will also help you to be aware of what you are eating and how much you are eating. What are tips for following this plan? Reading food labels  Check the calorie count compared to the serving size. The serving size may be smaller than what you are used to  eating.  Check the source of the calories. Make sure the food you are eating is high in vitamins and protein and low in saturated and trans fats. Shopping  Read nutrition labels while you shop. This will help you make healthy decisions before you decide to purchase your food.  Make a grocery list and stick to it. Cooking  Try to cook your favorite foods in a healthier way. For example, try baking instead of frying.  Use low-fat dairy products. Meal planning  Use more fruits and vegetables. Half of your plate should be fruits   and vegetables.  Include lean proteins like poultry and fish. How do I count calories when eating out?  Ask for smaller portion sizes.  Consider sharing an entree and sides instead of getting your own entree.  If you get your own entree, eat only half. Ask for a box at the beginning of your meal and put the rest of your entree in it so you are not tempted to eat it.  If calories are listed on the menu, choose the lower calorie options.  Choose dishes that include vegetables, fruits, whole grains, low-fat dairy products, and lean protein.  Choose items that are boiled, broiled, grilled, or steamed. Stay away from items that are buttered, battered, fried, or served with cream sauce. Items labeled "crispy" are usually fried, unless stated otherwise.  Choose water, low-fat milk, unsweetened iced tea, or other drinks without added sugar. If you want an alcoholic beverage, choose a lower calorie option such as a glass of wine or light beer.  Ask for dressings, sauces, and syrups on the side. These are usually high in calories, so you should limit the amount you eat.  If you want a salad, choose a garden salad and ask for grilled meats. Avoid extra toppings like bacon, cheese, or fried items. Ask for the dressing on the side, or ask for olive oil and vinegar or lemon to use as dressing.  Estimate how many servings of a food you are given. For example, a serving of  cooked rice is  cup or about the size of half a baseball. Knowing serving sizes will help you be aware of how much food you are eating at restaurants. The list below tells you how big or small some common portion sizes are based on everyday objects: ? 1 oz--4 stacked dice. ? 3 oz--1 deck of cards. ? 1 tsp--1 die. ? 1 Tbsp-- a ping-pong ball. ? 2 Tbsp--1 ping-pong ball. ?  cup-- baseball. ? 1 cup--1 baseball. Summary  Calorie counting means keeping track of how many calories you eat and drink each day. If you eat fewer calories than your body needs, you should lose weight.  A healthy amount of weight to lose per week is usually 1-2 lb (0.5-0.9 kg). This usually means reducing your daily calorie intake by 500-750 calories.  The number of calories in a food can be found on a Nutrition Facts label. If a food does not have a Nutrition Facts label, try to look up the calories online or ask your dietitian for help.  Use your calories on foods and drinks that will fill you up, and not on foods and drinks that will leave you hungry.  Use smaller plates, glasses, and bowls to prevent overeating. This information is not intended to replace advice given to you by your health care provider. Make sure you discuss any questions you have with your health care provider. Document Revised: 09/22/2017 Document Reviewed: 12/04/2015 Elsevier Patient Education  2020 [employer] DASH Eating Plan DASH stands for "Dietary Approaches to Stop Hypertension." The DASH eating plan is a healthy eating plan that has been shown to reduce high blood pressure (hypertension). It may also reduce your risk for type 2 diabetes, heart disease, and stroke. The DASH eating plan may also help with weight loss. What are tips for following this plan?  General guidelines  Avoid eating more than 2,300 mg (milligrams) of salt (sodium) a day. If you have hypertension, you may need to reduce your sodium intake to 1,500 mg   a  day.  Limit alcohol intake to no more than 1 drink a day for nonpregnant women and 2 drinks a day for men. One drink equals 12 oz of beer, 5 oz of wine, or 1 oz of hard liquor.  Work with your health care provider to maintain a healthy body weight or to lose weight. Ask what an ideal weight is for you.  Get at least 30 minutes of exercise that causes your heart to beat faster (aerobic exercise) most days of the week. Activities may include walking, swimming, or biking.  Work with your health care provider or diet and nutrition specialist (dietitian) to adjust your eating plan to your individual calorie needs. Reading food labels   Check food labels for the amount of sodium per serving. Choose foods with less than 5 percent of the Daily Value of sodium. Generally, foods with less than 300 mg of sodium per serving fit into this eating plan.  To find whole grains, look for the word "whole" as the first word in the ingredient list. Shopping  Buy products labeled as "low-sodium" or "no salt added."  Buy fresh foods. Avoid canned foods and premade or frozen meals. Cooking  Avoid adding salt when cooking. Use salt-free seasonings or herbs instead of table salt or sea salt. Check with your health care provider or pharmacist before using salt substitutes.  Do not fry foods. Cook foods using healthy methods such as baking, boiling, grilling, and broiling instead.  Cook with heart-healthy oils, such as olive, canola, soybean, or sunflower oil. Meal planning  Eat a balanced diet that includes: ? 5 or more servings of fruits and vegetables each day. At each meal, try to fill half of your plate with fruits and vegetables. ? Up to 6-8 servings of whole grains each day. ? Less than 6 oz of lean meat, poultry, or fish each day. A 3-oz serving of meat is about the same size as a deck of cards. One egg equals 1 oz. ? 2 servings of low-fat dairy each day. ? A serving of nuts, seeds, or beans 5 times  each week. ? Heart-healthy fats. Healthy fats called Omega-3 fatty acids are found in foods such as flaxseeds and coldwater fish, like sardines, salmon, and mackerel.  Limit how much you eat of the following: ? Canned or prepackaged foods. ? Food that is high in trans fat, such as fried foods. ? Food that is high in saturated fat, such as fatty meat. ? Sweets, desserts, sugary drinks, and other foods with added sugar. ? Full-fat dairy products.  Do not salt foods before eating.  Try to eat at least 2 vegetarian meals each week.  Eat more home-cooked food and less restaurant, buffet, and fast food.  When eating at a restaurant, ask that your food be prepared with less salt or no salt, if possible. What foods are recommended? The items listed may not be a complete list. Talk with your dietitian about what dietary choices are best for you. Grains Whole-grain or whole-wheat bread. Whole-grain or whole-wheat pasta. Brown rice. Oatmeal. Quinoa. Bulgur. Whole-grain and low-sodium cereals. Pita bread. Low-fat, low-sodium crackers. Whole-wheat flour tortillas. Vegetables Fresh or frozen vegetables (raw, steamed, roasted, or grilled). Low-sodium or reduced-sodium tomato and vegetable juice. Low-sodium or reduced-sodium tomato sauce and tomato paste. Low-sodium or reduced-sodium canned vegetables. Fruits All fresh, dried, or frozen fruit. Canned fruit in natural juice (without added sugar). Meat and other protein foods Skinless chicken or turkey. Ground chicken or turkey.   Pork with fat trimmed off. Fish and seafood. Egg whites. Dried beans, peas, or lentils. Unsalted nuts, nut butters, and seeds. Unsalted canned beans. Lean cuts of beef with fat trimmed off. Low-sodium, lean deli meat. Dairy Low-fat (1%) or fat-free (skim) milk. Fat-free, low-fat, or reduced-fat cheeses. Nonfat, low-sodium ricotta or cottage cheese. Low-fat or nonfat yogurt. Low-fat, low-sodium cheese. Fats and oils Soft margarine  without trans fats. Vegetable oil. Low-fat, reduced-fat, or light mayonnaise and salad dressings (reduced-sodium). Canola, safflower, olive, soybean, and sunflower oils. Avocado. Seasoning and other foods Herbs. Spices. Seasoning mixes without salt. Unsalted popcorn and pretzels. Fat-free sweets. What foods are not recommended? The items listed may not be a complete list. Talk with your dietitian about what dietary choices are best for you. Grains Baked goods made with fat, such as croissants, muffins, or some breads. Dry pasta or rice meal packs. Vegetables Creamed or fried vegetables. Vegetables in a cheese sauce. Regular canned vegetables (not low-sodium or reduced-sodium). Regular canned tomato sauce and paste (not low-sodium or reduced-sodium). Regular tomato and vegetable juice (not low-sodium or reduced-sodium). Pickles. Olives. Fruits Canned fruit in a light or heavy syrup. Fried fruit. Fruit in cream or butter sauce. Meat and other protein foods Fatty cuts of meat. Ribs. Fried meat. Bacon. Sausage. Bologna and other processed lunch meats. Salami. Fatback. Hotdogs. Bratwurst. Salted nuts and seeds. Canned beans with added salt. Canned or smoked fish. Whole eggs or egg yolks. Chicken or turkey with skin. Dairy Whole or 2% milk, cream, and half-and-half. Whole or full-fat cream cheese. Whole-fat or sweetened yogurt. Full-fat cheese. Nondairy creamers. Whipped toppings. Processed cheese and cheese spreads. Fats and oils Butter. Stick margarine. Lard. Shortening. Ghee. Bacon fat. Tropical oils, such as coconut, palm kernel, or palm oil. Seasoning and other foods Salted popcorn and pretzels. Onion salt, garlic salt, seasoned salt, table salt, and sea salt. Worcestershire sauce. Tartar sauce. Barbecue sauce. Teriyaki sauce. Soy sauce, including reduced-sodium. Steak sauce. Canned and packaged gravies. Fish sauce. Oyster sauce. Cocktail sauce. Horseradish that you find on the shelf. Ketchup.  Mustard. Meat flavorings and tenderizers. Bouillon cubes. Hot sauce and Tabasco sauce. Premade or packaged marinades. Premade or packaged taco seasonings. Relishes. Regular salad dressings. Where to find more information:  National Heart, Lung, and Blood Institute: www.nhlbi.nih.gov  American Heart Association: www.heart.org Summary  The DASH eating plan is a healthy eating plan that has been shown to reduce high blood pressure (hypertension). It may also reduce your risk for type 2 diabetes, heart disease, and stroke.  With the DASH eating plan, you should limit salt (sodium) intake to 2,300 mg a day. If you have hypertension, you may need to reduce your sodium intake to 1,500 mg a day.  When on the DASH eating plan, aim to eat more fresh fruits and vegetables, whole grains, lean proteins, low-fat dairy, and heart-healthy fats.  Work with your health care provider or diet and nutrition specialist (dietitian) to adjust your eating plan to your individual calorie needs. This information is not intended to replace advice given to you by your health care provider. Make sure you discuss any questions you have with your health care provider. Document Revised: 12/16/2016 Document Reviewed: 12/28/2015 Elsevier Patient Education  2020 Elsevier Inc.  

## 2019-12-31 ENCOUNTER — Ambulatory Visit: Payer: Self-pay | Admitting: Surgery

## 2020-01-06 ENCOUNTER — Other Ambulatory Visit: Payer: Self-pay

## 2020-01-06 ENCOUNTER — Ambulatory Visit: Payer: Self-pay | Admitting: Pharmacy Technician

## 2020-01-06 DIAGNOSIS — Z79899 Other long term (current) drug therapy: Secondary | ICD-10-CM

## 2020-01-06 NOTE — Progress Notes (Signed)
Completed [facility] application and contract.  Patient agreed to all terms of the [facility] contract.    Patient to provide last 30 days of banking statements. Eligibility will be determined once requested financial information is received by [facility].    Provided patient with community resource material based on her particular needs.    Sherilyn Dacosta Care Manager [facility]

## 2020-01-09 ENCOUNTER — Ambulatory Visit: Payer: Self-pay | Admitting: Surgery

## 2020-01-14 ENCOUNTER — Ambulatory Visit: Payer: Self-pay | Admitting: Pharmacist

## 2020-01-14 ENCOUNTER — Other Ambulatory Visit: Payer: Self-pay

## 2020-01-14 DIAGNOSIS — Z79899 Other long term (current) drug therapy: Secondary | ICD-10-CM

## 2020-01-14 NOTE — Progress Notes (Signed)
[facility] Visit Note  Patient: Emma Hamilton MRN: [id number] Date of Birth: 1985-11-10 PCP: Rolm Gala, NP   Emma Hamilton 34 y.o. female called for MTM visit today. Confirmed patient identity with name and DOB. Patient has PMH for asthma and HTN.   There were no vitals taken for this visit.  Patient Information   Past Medical History:  Diagnosis Date  . Asthma   . Hypertension       Past Surgical History:  Procedure Laterality Date  . CESAREAN SECTION       Family History  Problem Relation Age of Onset  . Diabetes Mother   . Hypertension Mother   . Hypertension Maternal Grandfather   . Diabetes Maternal Grandfather     New Diagnoses (since last visit): N/A  Lifestyle Diet: Breakfast/Lunch:rice, hot dog burrito Dinner:mcdonalds Drinks:water or soda  Exercise: Unable to do anything too strenuous due to knee pain. Uses chair exercises and physical therapy videos from Youtube as exercise. Patient states the videos are targeted to people with knee injuries.            Social History   Substance and Sexual Activity  Alcohol Use Not Currently      Social History   Tobacco Use  Smoking Status Former Smoker  . Packs/day: 0.50  . Types: Cigarettes  Smokeless Tobacco Never Used      Health Maintenance  Topic Date Due  . Hepatitis C Screening  Never done  . COVID-19 Vaccine (1) Never done  . HIV Screening  Never done  . PAP SMEAR-Modifier  Never done  . TETANUS/TDAP  01/30/2019  . INFLUENZA VACCINE  Completed   Assessment and Plan:  Hypertension Patient last BP was 158/110, however, this was prior to starting her medication - chlorthalidone 12.5mg  daily. Patient states she has a BP cuff but has misplaced it in the process of moving. Encouraged patient to keep a log of her BP readings and bring to her appointments once she is able to find her BP cuff. Also encouraged the patient to try and me more consistent with  DASH diet. Patient was a former smoker; she quit 1.5 years ago.   Asthma Patient takes albuterol as needed for wheezing and SOB. Patient had to use her albuterol last week on three different days but her symptoms were controlled after using the inhaler one time on those days. Patient has not had to use her inhaler this week.   Torn meniscus/knee pain Patient reports knee pain and is taking ibuprofen and acetaminophen as needed for her pain. She has been exercising as tolerated. She has been doing chair exercises and physical therapy exercises from [facility] that are specific for patient's with injuries. Encouraged patient to continue exercise as tolerated.   Prediabetes Patient's A1c was 5.9% on 10/23/19. Encouraged patient to continue to exercise as tolerated and to avoid sweets and to comply with DASH diet. Patient also had LDL 114 10/23/19. Patient states she will continue lifestyle changes in efforts to manage her blood sugar and lipids.   Presence of contraceptive device Patient states this is planned to be removed on Thursday (01/16/20).   Adherence  Patient does not have a pill box, however she states she is compliant. She actually reminds her husband every day to take his medications and this also serves as a reminder for herself.    Raiford Noble, PharmD Pharmacy Resident  01/14/2020 2:05 PM

## 2020-01-20 ENCOUNTER — Encounter: Payer: Self-pay | Admitting: *Deleted

## 2020-01-23 ENCOUNTER — Other Ambulatory Visit: Payer: Self-pay

## 2020-01-23 ENCOUNTER — Ambulatory Visit (INDEPENDENT_AMBULATORY_CARE_PROVIDER_SITE_OTHER): Payer: Self-pay | Admitting: Surgery

## 2020-01-23 ENCOUNTER — Encounter: Payer: Self-pay | Admitting: Surgery

## 2020-01-23 VITALS — BP 158/90 | HR 110 | Temp 97.9°F | Resp 18 | Ht 63.0 in | Wt 361.0 lb

## 2020-01-23 DIAGNOSIS — Z309 Encounter for contraceptive management, unspecified: Secondary | ICD-10-CM

## 2020-01-23 NOTE — Progress Notes (Signed)
Patient ID: Emma Hamilton, female   DOB: 02-Apr-1985, 35 y.o.   MRN: [id number]  Chief Complaint:  Nexplanon implant  History of Present Illness Emma Hamilton is a 35 y.o. female with an 8-year history of a Nexplanon implant in her left medial arm.  Was supposed removed about 3 years ago however the health department refused to remove it reportedly with concerns that it had migrated.  There is some concern about hormonal irregularity and felt to be potentially blamed on the old implant.  However the patient feels that should be removed and wants it out.  Past Medical History Past Medical History:  Diagnosis Date  . Allergy   . Asthma   . Hypertension       Past Surgical History:  Procedure Laterality Date  . CESAREAN SECTION      No Known Allergies  Current Outpatient Medications  Medication Sig Dispense Refill  . acetaminophen (TYLENOL) 500 MG tablet Take 1,000 mg by mouth every 6 (six) hours as needed (knee pain).    Marland Kitchen albuterol (VENTOLIN HFA) 108 (90 Base) MCG/ACT inhaler Inhale 2 puffs into the lungs every 6 (six) hours as needed for wheezing or shortness of breath. 6.7 g 2  . chlorthalidone (HYGROTON) 25 MG tablet Take 0.5 tablets (12.5 mg total) by mouth daily. 30 tablet 2  . ibuprofen (ADVIL) 600 MG tablet Take 600 mg by mouth every 6 (six) hours as needed for mild pain or moderate pain (knee pain).    . Blood Pressure Monitoring (BLOOD PRESSURE MONITOR AUTOMAT) DEVI 1 each by Does not apply route 2 (two) times daily. 1 kit 0   No current facility-administered medications for this visit.    Family History Family History  Problem Relation Age of Onset  . Diabetes Mother   . Hypertension Mother   . Hypertension Maternal Grandfather   . Diabetes Maternal Grandfather       Social History Social History   Tobacco Use  . Smoking status: Former Smoker    Packs/day: 0.50    Types: Cigarettes  . Smokeless tobacco: Never Used  Vaping Use  . Vaping Use:  Never used  Substance Use Topics  . Alcohol use: Not Currently  . Drug use: No        Review of Systems  Constitutional: Positive for malaise/fatigue.  HENT: Negative.   Eyes: Negative.   Respiratory: Positive for shortness of breath.   Cardiovascular: Negative.   Gastrointestinal: Positive for heartburn.  Genitourinary: Negative.   Skin: Negative.   Neurological: Positive for headaches.  Psychiatric/Behavioral: Negative.       Physical Exam Blood pressure (!) 158/90, pulse (!) 110, temperature 97.9 F (36.6 C), resp. rate 18, height 5' 3" (1.6 m), weight (!) 361 lb (163.7 kg), last menstrual period 01/06/2020, SpO2 98 %. Last Weight  Most recent update: 01/23/2020 10:23 AM   Weight  163.7 kg (361 lb)              CONSTITUTIONAL: Well developed, morbidly obese, and adequately nourished, appropriately responsive and aware without distress.   EYES: Sclera non-icteric.   EARS, NOSE, MOUTH AND THROAT: Mask worn.    Hearing is intact to voice.  NECK: Trachea is midline, and there is no jugular venous distension.  LYMPH NODES:  Lymph nodes in the neck are not enlarged. RESPIRATORY:  Lungs are clear, and breath sounds are equal bilaterally. Normal respiratory effort without pathologic use of accessory muscles. CARDIOVASCULAR: Heart is regular in rate and rhythm.  GI: The abdomen is  soft, nontender, and nondistended.  MUSCULOSKELETAL:  Symmetrical muscle tone appreciated in all four extremities.    SKIN: Skin turgor is normal. No pathologic skin lesions appreciated.  There is a palpable 2 inch long tubular device immediately under the skin of her medial left arm, readily presentable to the skin with the patient's assistance.  No evidence of inflammatory changes, induration or excessive scarring. NEUROLOGIC:  Motor and sensation appear grossly normal.  Cranial nerves are grossly without defect. PSYCH:  Alert and oriented to person, place and time. Affect is appropriate for  situation.  Data Reviewed I have personally reviewed what is currently available of the patient's imaging, recent labs and medical records.   Labs:  CBC Latest Ref Rng & Units 10/23/2019 10/26/2018 09/19/2018  WBC 3.4 - 10.8 x10E3/uL 9.4 9.8 9.5  Hemoglobin 11.1 - 15.9 g/dL 12.9 12.5 12.7  Hematocrit 34.0 - 46.6 % 38.3 38.5 37.9  Platelets 150 - 450 x10E3/uL 265 309 286   CMP Latest Ref Rng & Units 10/23/2019 10/26/2018 09/19/2018  Glucose 65 - 99 mg/dL 94 104(H) 96  BUN 6 - 20 mg/dL 18 21(H) 12  Creatinine 0.57 - 1.00 mg/dL 0.77 0.56 0.68  Sodium 134 - 144 mmol/L 140 138 139  Potassium 3.5 - 5.2 mmol/L 4.4 4.0 4.2  Chloride 96 - 106 mmol/L 104 105 103  CO2 20 - 29 mmol/L 22 24 22  Calcium 8.7 - 10.2 mg/dL 8.9 9.0 8.9  Total Protein 6.0 - 8.5 g/dL 6.8 - 6.3  Total Bilirubin 0.0 - 1.2 mg/dL 0.3 - 0.3  Alkaline Phos 44 - 121 IU/L 76 - 80  AST 0 - 40 IU/L 15 - 14  ALT 0 - 32 IU/L 22 - 19      Imaging:  Within last 24 hrs: No results found.  Assessment    Foreign body of medial left upper arm, consistent with Nexplanon contraceptive device is noted. Patient Active Problem List   Diagnosis Date Noted  . Presence of contraceptive device 12/19/2019  . Hematuria 11/14/2019  . Prediabetes 11/14/2019  . Edema of both lower extremities 11/14/2019  . Hydradenitis 10/03/2019  . Morbid obesity (HCC) 09/26/2018  . Elevated lipids 09/26/2018  . Health care maintenance 09/26/2018  . Discharge from ear, right 07/31/2018  . Elevated blood pressure reading 07/31/2018    Plan    Proceed with local excision of foreign body from medial left upper arm. Risks and benefits discussed with the patient including but not limited to anesthesia, bleeding, infection.  I believe she understands and desires to proceed with local anesthesia here in the office.  Face-to-face time spent with the patient and accompanying care providers(if present) was 15 minutes, with more than 50% of the time spent  counseling, educating, and coordinating care of the patient.      Denny Rodenberg M.D., FACS 01/23/2020, 10:58 AM  Removal of previously implanted contraceptive device medial left arm.  (Excision of foreign body)  Pre-operative Diagnosis: Implanted contraceptive device, left upper arm.  Post-operative Diagnosis: same.    Surgeon: Denny Rodenberg, M.D., FACS  Carolyn present.  Anesthesia: Local  Findings: Complete device intact and removed.  Estimated Blood Loss: 0 mL         Specimens: Consistent with Nexplanon device, discarded.          Complications: none              Procedure Details  The patient was seen again in the procedure   room 9. The benefits, complications, treatment options, and expected outcomes were discussed with the patient. The risks of bleeding, infection, recurrence of symptoms, failure to resolve symptoms, unanticipated injury, prosthetic placement, prosthetic infection, any of which could require further surgery were reviewed with the patient. The likelihood of improving the patient's symptoms with return to their baseline status is good.  The patient and/or family concurred with the proposed plan, giving informed consent.  The patient was taken to Operating Room, identified and the procedure verified.   The patient was positioned in the sitting position and the medial left arm was prepped with Chloraprep and draped in the sterile fashion.  A Time Out was held and the above information confirmed. Local infiltration with 1% lidocaine with epinephrine is completed overlying the palpable implant.  A subcentimeter incision is made, the implant is grasped and excised from the adjacent scar tissue.  The incision is then closed with Dermabond.  There was no bleeding.  The procedure was tolerated well.  Instructions given. Your incision was closed with Dermabond.  It is best to keep it clean and dry, it will tolerate a brief shower, but do not soak it or apply any  creams or lotions to the incisions.  The Dermabond should gradually flake off over time.  Keep it open to air so you can evaluate your incisions.  Dermabond assists the underlying sutures to keep your incision closed and protected from infection.  Should you develop some drainage from your incision, some drops of drainage would be okay but if it persists continue to put keep a dry dressing over it.  She may follow-up as needed.    Ronny Bacon M.D., [facility] 01/23/2020 11:05 AM

## 2020-01-23 NOTE — Patient Instructions (Signed)
Keep the area clean and dry. You may shower but keep the area as dry as possible, pat dry if it gets wet. The glue should start to come off in 1-2 weeks.   Watch out for any increasing redness, pain, or drainage. Call us if you develop a fever or any of these other symptoms.

## 2020-03-18 ENCOUNTER — Ambulatory Visit: Payer: Self-pay | Admitting: Gerontology

## 2020-03-19 ENCOUNTER — Telehealth: Payer: Self-pay

## 2020-03-19 NOTE — Telephone Encounter (Signed)
rescheduled 3/2 appt to 3/3

## 2020-04-07 ENCOUNTER — Ambulatory Visit: Payer: Self-pay | Admitting: Gerontology

## 2020-04-09 ENCOUNTER — Ambulatory Visit: Payer: Self-pay | Admitting: Gerontology

## 2020-04-09 ENCOUNTER — Other Ambulatory Visit: Payer: Self-pay | Admitting: Gerontology

## 2020-04-09 ENCOUNTER — Encounter: Payer: Self-pay | Admitting: Gerontology

## 2020-04-09 ENCOUNTER — Other Ambulatory Visit: Payer: Self-pay

## 2020-04-09 VITALS — BP 154/90 | HR 96 | Resp 16 | Wt 361.9 lb

## 2020-04-09 DIAGNOSIS — I10 Essential (primary) hypertension: Secondary | ICD-10-CM | POA: Insufficient documentation

## 2020-04-09 DIAGNOSIS — R030 Elevated blood-pressure reading, without diagnosis of hypertension: Secondary | ICD-10-CM

## 2020-04-09 DIAGNOSIS — G8929 Other chronic pain: Secondary | ICD-10-CM | POA: Insufficient documentation

## 2020-04-09 DIAGNOSIS — L732 Hidradenitis suppurativa: Secondary | ICD-10-CM

## 2020-04-09 MED ORDER — CHLORTHALIDONE 25 MG PO TABS: 12.5000 mg | ORAL_TABLET | Freq: Every day | ORAL | 2 refills | Status: DC

## 2020-04-09 NOTE — Patient Instructions (Signed)

## 2020-04-09 NOTE — Progress Notes (Signed)
Established Patient Office Visit  Subjective:  Patient ID: Emma Hamilton, female    DOB: 1985/10/28  Age: 35 y.o. MRN: [id number]  CC: Blood pressure.  HPI [place] 35 y/o female who has history of hidradenitis, hypertension and Asthma presents for follow up of hypertension. She states that she was out of her medication for 2 months and unable to keep her appointment. She doesn't check her blood pressure at home and c/o intermittent headache, denies vision changes. She also has a history of hidradenitis to lower abdominal folds and experiences intermittent out breaks. She has not followed up with Dermatologists and denies any outbreak during visit.  She states that her chronic right shoulder pain has worsened. She states that the pain is constant, non radiating discomfort 4/10. She denies any relieving factor, states that lying on her right side and carrying her pocket books aggravates symptoms. She denies muscle or motor weakness. Overall, she states that she's doing well and offers no further complaint.        Past Medical History:  Diagnosis Date  . Allergy   . Asthma   . Hypertension     Past Surgical History:  Procedure Laterality Date  . CESAREAN SECTION      Family History  Problem Relation Age of Onset  . Diabetes Mother   . Hypertension Mother   . Hypertension Maternal Grandfather   . Diabetes Maternal Grandfather     Social History   Socioeconomic History  . Marital status: Married    Spouse name: Not on file  . Number of children: Not on file  . Years of education: Not on file  . Highest education level: Not on file  Occupational History  . Not on file  Tobacco Use  . Smoking status: Former Smoker    Packs/day: 0.50    Types: Cigarettes  . Smokeless tobacco: Never Used  Vaping Use  . Vaping Use: Never used  Substance and Sexual Activity  . Alcohol use: Not Currently  . Drug use: No  . Sexual activity: Not on file  Other Topics  Concern  . Not on file  Social History Narrative  . Not on file   Social Determinants of Health   Financial Resource Strain: Not on file  Food Insecurity: Not on file  Transportation Needs: Not on file  Physical Activity: Not on file  Stress: Not on file  Social Connections: Not on file  Intimate Partner Violence: Not on file    Outpatient Medications Prior to Visit  Medication Sig Dispense Refill  . acetaminophen (TYLENOL) 500 MG tablet Take 1,000 mg by mouth every 6 (six) hours as needed (knee pain).    Marland Kitchen albuterol (VENTOLIN HFA) 108 (90 Base) MCG/ACT inhaler Inhale 2 puffs into the lungs every 6 (six) hours as needed for wheezing or shortness of breath. (Patient not taking: Reported on 04/09/2020) 6.7 g 2  . Blood Pressure Monitoring (BLOOD PRESSURE MONITOR AUTOMAT) DEVI 1 each by Does not apply route 2 (two) times daily. 1 kit 0  . ibuprofen (ADVIL) 600 MG tablet Take 600 mg by mouth every 6 (six) hours as needed for mild pain or moderate pain (knee pain). (Patient not taking: Reported on 04/09/2020)    . chlorthalidone (HYGROTON) 25 MG tablet Take 0.5 tablets (12.5 mg total) by mouth daily. (Patient not taking: Reported on 04/09/2020) 30 tablet 2   No facility-administered medications prior to visit.    No Known Allergies  ROS Review of Systems  Constitutional: Negative.   Eyes: Negative.   Respiratory: Negative.   Cardiovascular: Negative.   Gastrointestinal: Negative.   Skin: Positive for rash (hidradenitis to abdominal fold.).  Neurological: Positive for headaches.  Psychiatric/Behavioral: Negative.       Objective:    Physical Exam HENT:     Head: Normocephalic and atraumatic.  Eyes:     Extraocular Movements: Extraocular movements intact.     Conjunctiva/sclera: Conjunctivae normal.     Pupils: Pupils are equal, round, and reactive to light.  Cardiovascular:     Rate and Rhythm: Normal rate and regular rhythm.     Pulses: Normal pulses.     Heart sounds:  Normal heart sounds.  Pulmonary:     Effort: Pulmonary effort is normal.     Breath sounds: Normal breath sounds.  Musculoskeletal:        General: Tenderness (mild tenderness with palpation to right shoulder) present.  Skin:    General: Skin is warm.  Neurological:     General: No focal deficit present.     Mental Status: She is alert and oriented to person, place, and time. Mental status is at baseline.  Psychiatric:        Mood and Affect: Mood normal.        Behavior: Behavior normal.        Thought Content: Thought content normal.        Judgment: Judgment normal.     BP (!) 154/90 (BP Location: Left Arm, Patient Position: Sitting, Cuff Size: Large)   Pulse 96   Resp 16   Wt (!) 361 lb 14.4 oz (164.2 kg)   SpO2 98%   BMI 64.11 kg/m  Wt Readings from Last 3 Encounters:  04/09/20 (!) 361 lb 14.4 oz (164.2 kg)  01/23/20 (!) 361 lb (163.7 kg)  12/19/19 (!) 363 lb 9.6 oz (164.9 kg)   Encouraged weight loss  Health Maintenance Due  Topic Date Due  . Hepatitis C Screening  Never done  . COVID-19 Vaccine (1) Never done  . HIV Screening  Never done  . PAP SMEAR-Modifier  Never done  . TETANUS/TDAP  01/30/2019    There are no preventive care reminders to display for this patient.  Lab Results  Component Value Date   TSH 3.420 10/23/2019   Lab Results  Component Value Date   WBC 9.4 10/23/2019   HGB 12.9 10/23/2019   HCT 38.3 10/23/2019   MCV 87 10/23/2019   PLT 265 10/23/2019   Lab Results  Component Value Date   NA 140 10/23/2019   K 4.4 10/23/2019   CO2 22 10/23/2019   GLUCOSE 94 10/23/2019   BUN 18 10/23/2019   CREATININE 0.77 10/23/2019   BILITOT 0.3 10/23/2019   ALKPHOS 76 10/23/2019   AST 15 10/23/2019   ALT 22 10/23/2019   PROT 6.8 10/23/2019   ALBUMIN 4.2 10/23/2019   CALCIUM 8.9 10/23/2019   ANIONGAP 9 10/26/2018   Lab Results  Component Value Date   CHOL 181 10/23/2019   Lab Results  Component Value Date   HDL 45 10/23/2019   Lab  Results  Component Value Date   LDLCALC 114 (H) 10/23/2019   Lab Results  Component Value Date   TRIG 122 10/23/2019   Lab Results  Component Value Date   CHOLHDL 4.0 10/23/2019   Lab Results  Component Value Date   HGBA1C 5.9 (H) 10/23/2019      Assessment & Plan:    1. Essential hypertension -  Her blood pressure is not controlled, was out of her medication. She will resume current medication, advised to check blood pressure, record and bring log to follow up visit and DASH diet. - chlorthalidone (HYGROTON) 25 MG tablet; Take 0.5 tablets (12.5 mg total) by mouth daily.  Dispense: 30 tablet; Refill: 2 - Ophthalmology exam will be scheduled.    2. Hydradenitis - She was advised to maintain proper hygiene and - Ambulatory referral to Ophthalmology    3. Chronic right shoulder pain - She will follow up with [facility] Orthopedic Doctor, Dr Vickki Hearing, and to go to the ED for worsening symptoms.    Follow-up: Return in about 6 weeks (around 05/21/2020), or if symptoms worsen or fail to improve.    Alinna Siple Jerold Coombe, NP

## 2020-04-21 ENCOUNTER — Other Ambulatory Visit: Payer: Self-pay

## 2020-04-21 ENCOUNTER — Ambulatory Visit: Payer: Self-pay | Admitting: Specialist

## 2020-04-21 NOTE — Progress Notes (Unsigned)
HPI Several month history of shoulder pain with no history of trauma. Has tried to "not use arm"; I've told her that's not the thing to do. 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 She can no longer sleep on that side. She no longer wears a bra. She would not be able to reach behind to fasten the bra and back.   PHYSICAL Left shoulder has a FROM. On inspection, there is no atrophy, erythremia, or boney abnormality. Mild TTP especially over lateral acromion.  ROM on right side. She has pain after 30 degrees of FF up to 160 degrees. She does not have a painful arc. Ext: 40 degrees. Add: neutral. Abd: 50 degrees. IR/ER: 80/70 degrees.  + Hawkin's sign. Rotator cuff strength 5/5.   PLAN  Physical Therapy.

## 2020-04-30 ENCOUNTER — Ambulatory Visit (LOCAL_COMMUNITY_HEALTH_CENTER): Payer: Self-pay

## 2020-04-30 ENCOUNTER — Encounter: Payer: Self-pay | Admitting: Gerontology

## 2020-04-30 ENCOUNTER — Other Ambulatory Visit: Payer: Self-pay

## 2020-04-30 VITALS — BP 155/93 | Ht 63.0 in | Wt 352.0 lb

## 2020-04-30 DIAGNOSIS — Z3201 Encounter for pregnancy test, result positive: Secondary | ICD-10-CM

## 2020-04-30 LAB — PREGNANCY, URINE: Preg Test, Ur: POSITIVE — AB

## 2020-04-30 MED ORDER — PRENATAL 27-0.8 MG PO TABS
1.0000 | ORAL_TABLET | Freq: Every day | ORAL | 0 refills | Status: AC
Start: 2020-04-30 — End: 2020-08-08

## 2020-04-30 NOTE — Progress Notes (Signed)
UPT positive today. Unsure where she plans prenatal care. Local provider resource list given with high risk recommendations explained. Elevated BP today 155/93 and reports taking "water pill" that was prescribed from [facility] 01/2020. Pt unsure of name of med.  Consult ARobbie Lis, PA-C who recommends pt to contact prescribing provider for her diuretic today for guidance and safety on taking this medication during preg. Also advises pt to establish prenatal care ASAP as she would be considered high risk. RN carried out provider recommendations. RN advised pt to seek immediate medical attention/go to ER if medical distress or if any increased abd pain and/or bleeding. Pt in agreement. To DSS for Medicaid/Preg women. Jerel Shepherd, RN

## 2020-05-04 ENCOUNTER — Other Ambulatory Visit: Payer: Self-pay

## 2020-05-04 ENCOUNTER — Emergency Department
Admission: EM | Admit: 2020-05-04 | Discharge: 2020-05-05 | Disposition: A | Discharge status: Home / Self Care | Payer: Medicaid Other | Attending: Emergency Medicine | Admitting: Emergency Medicine

## 2020-05-04 DIAGNOSIS — J45909 Unspecified asthma, uncomplicated: Secondary | ICD-10-CM | POA: Diagnosis not present

## 2020-05-04 DIAGNOSIS — O131 Gestational [pregnancy-induced] hypertension without significant proteinuria, first trimester: Secondary | ICD-10-CM | POA: Diagnosis not present

## 2020-05-04 DIAGNOSIS — I10 Essential (primary) hypertension: Secondary | ICD-10-CM

## 2020-05-04 DIAGNOSIS — Y92410 Unspecified street and highway as the place of occurrence of the external cause: Secondary | ICD-10-CM | POA: Diagnosis not present

## 2020-05-04 DIAGNOSIS — Z87891 Personal history of nicotine dependence: Secondary | ICD-10-CM | POA: Insufficient documentation

## 2020-05-04 DIAGNOSIS — M25511 Pain in right shoulder: Secondary | ICD-10-CM

## 2020-05-04 DIAGNOSIS — O26891 Other specified pregnancy related conditions, first trimester: Secondary | ICD-10-CM | POA: Insufficient documentation

## 2020-05-04 DIAGNOSIS — R7303 Prediabetes: Secondary | ICD-10-CM | POA: Diagnosis not present

## 2020-05-04 DIAGNOSIS — O2341 Unspecified infection of urinary tract in pregnancy, first trimester: Secondary | ICD-10-CM | POA: Insufficient documentation

## 2020-05-04 DIAGNOSIS — Z3A01 Less than 8 weeks gestation of pregnancy: Secondary | ICD-10-CM | POA: Diagnosis not present

## 2020-05-04 DIAGNOSIS — M79641 Pain in right hand: Secondary | ICD-10-CM | POA: Diagnosis not present

## 2020-05-04 DIAGNOSIS — N390 Urinary tract infection, site not specified: Secondary | ICD-10-CM
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
## 2020-05-04 NOTE — ED Notes (Signed)
Pt unable to sign mse due to wrist pain. Verbal agreement

## 2020-05-05 ENCOUNTER — Emergency Department: Payer: Medicaid Other

## 2020-05-05 LAB — URINALYSIS, ROUTINE W REFLEX MICROSCOPIC
Bilirubin Urine: NEGATIVE
Glucose, UA: NEGATIVE mg/dL
Hgb urine dipstick: NEGATIVE
Ketones, ur: 5 mg/dL — AB
Leukocytes,Ua: NEGATIVE
Nitrite: NEGATIVE
Protein, ur: 30 mg/dL — AB
Specific Gravity, Urine: 1.033 — ABNORMAL HIGH (ref 1.005–1.030)
pH: 5 (ref 5.0–8.0)

## 2020-05-05 LAB — POC URINE PREG, ED: Preg Test, Ur: POSITIVE — AB

## 2020-05-05 MED ORDER — CEPHALEXIN 500 MG PO CAPS
500.0000 mg | ORAL_CAPSULE | Freq: Once | ORAL | Status: AC
  Administered 2020-05-05: 500 mg via ORAL
  Filled 2020-05-05: qty 1

## 2020-05-05 MED ORDER — CEPHALEXIN 500 MG PO CAPS: 500.0000 mg | ORAL_CAPSULE | Freq: Two times a day (BID) | ORAL | 0 refills | Status: AC

## 2020-05-05 MED ORDER — ACETAMINOPHEN 500 MG PO TABS
1000.0000 mg | ORAL_TABLET | Freq: Once | ORAL | Status: AC
  Administered 2020-05-05: 1000 mg via ORAL
  Filled 2020-05-05: qty 2

## 2020-05-05 NOTE — Discharge Instructions (Addendum)
You may take Tylenol 1000 mg every 6 hours as needed for pain.  Your x-rays showed no fracture or dislocation today.  You likely have contusions to these areas.  You may apply ice and elevate your right arm to help with pain, swelling.  Your urine appears mildly infected today.  Please take your antibiotics until complete.  Your ultrasound today was very reassuring.  You have a single pregnancy inside of the uterus that measured approximately 13 weeks and 1 day with an estimated due date of 11/09/2020.  I recommend close follow-up with your OB as an outpatient for monitoring of your blood pressure.

## 2020-05-05 NOTE — ED Notes (Signed)
Patient returned from ultrasound.

## 2020-05-06 ENCOUNTER — Other Ambulatory Visit: Payer: Self-pay

## 2020-05-06 ENCOUNTER — Other Ambulatory Visit: Payer: Self-pay | Admitting: Gerontology

## 2020-05-06 ENCOUNTER — Ambulatory Visit: Payer: Self-pay | Admitting: Dermatology

## 2020-05-06 DIAGNOSIS — Z3490 Encounter for supervision of normal pregnancy, unspecified, unspecified trimester: Secondary | ICD-10-CM

## 2020-05-06 LAB — URINE CULTURE

## 2020-05-06 MED ORDER — CEPHALEXIN 500 MG PO CAPS
ORAL_CAPSULE | ORAL | 0 refills | Status: AC
  Filled 2020-05-06 (×2): qty 14, 7d supply, fill #0

## 2020-05-21 ENCOUNTER — Ambulatory Visit: Payer: Self-pay | Admitting: Gerontology

## 2020-06-08 ENCOUNTER — Other Ambulatory Visit: Payer: Self-pay

## 2020-06-08 MED FILL — Albuterol Sulfate Inhal Aero 108 MCG/ACT (90MCG Base Equiv): RESPIRATORY_TRACT | 25 days supply | Qty: 6.7 | Fill #0 | Status: AC

## 2020-07-09 ENCOUNTER — Other Ambulatory Visit: Payer: Self-pay

## 2020-07-09 MED ORDER — HYDROXYZINE HCL 25 MG PO TABS: ORAL_TABLET | ORAL | 1 refills | Status: AC

## 2020-07-10 ENCOUNTER — Other Ambulatory Visit: Payer: Self-pay

## 2020-08-06 ENCOUNTER — Other Ambulatory Visit: Payer: Self-pay

## 2020-08-06 ENCOUNTER — Telehealth: Payer: Self-pay | Admitting: Pharmacy Technician

## 2020-08-06 NOTE — Telephone Encounter (Signed)
Patient failed to provide 2022 proof of income.  No additional medication assistance will be provided by [facility] without the required proof of income documentation.  Patient notified by letter.  Emma Hamilton Care Manager [facility]    [address] [place], [place]  [address]   August 06, 2020    [name] [address] [place], [place]  [address]  Dear Emma Hamilton:  This is to inform you that you are no longer eligible to receive medication assistance at [facility].  The reason(s) are:    _____Your total gross monthly household income exceeds 250% of the Federal Poverty Level.   _____Tangible assets (savings, checking, stocks/bonds, pension, retirement, etc.) exceeds our limit  _____You are eligible to receive benefits from [facility], [facility] or HIV Medication              Assistance Program _____You are eligible to receive benefits from a Medicare Part "D" plan _____You have prescription insurance  _____You are not an [facility] resident __X__Failure to provide all requested documentation.  Still need last 30 days of paystubs from spouse, 2021 Federal Tax Return and checking account statement for July of 2022.    Medication assistance will resume once all requested documentation has been returned to our clinic.  If you have questions, please contact our clinic at [phone].    Thank you,  [facility]

## 2020-09-16 ENCOUNTER — Ambulatory Visit (INDEPENDENT_AMBULATORY_CARE_PROVIDER_SITE_OTHER): Payer: Medicaid Other | Admitting: Dermatology

## 2020-09-16 ENCOUNTER — Other Ambulatory Visit: Payer: Self-pay

## 2020-09-16 DIAGNOSIS — L308 Other specified dermatitis: Secondary | ICD-10-CM | POA: Diagnosis not present

## 2020-09-16 DIAGNOSIS — L503 Dermatographic urticaria: Secondary | ICD-10-CM

## 2020-09-16 DIAGNOSIS — L408 Other psoriasis: Secondary | ICD-10-CM | POA: Diagnosis not present

## 2020-09-16 DIAGNOSIS — L219 Seborrheic dermatitis, unspecified: Secondary | ICD-10-CM

## 2020-09-16 MED ORDER — HYDROCORTISONE 2.5 % EX CREA: TOPICAL_CREAM | CUTANEOUS | 1 refills | Status: DC

## 2020-09-16 NOTE — Progress Notes (Signed)
   New Patient Visit  Subjective  Emma Hamilton is a 35 y.o. female who presents for the following: Dermatitis (Pt c/o itchy skin, started 1 year ago when pt was diagnosed with Covid-19, taking Hydroxyzine tablets ). [redacted] weeks pregnant.   Husband with patient who contributes to history  The following portions of the chart were reviewed this encounter and updated as appropriate:   Tobacco  Allergies  Meds  Problems  Med Hx  Surg Hx  Fam Hx     Review of Systems:  No other skin or systemic complaints except as noted in HPI or Assessment and Plan.  Objective  Well appearing patient in no apparent distress; mood and affect are within normal limits.  A focused examination was performed including face,scalp,neck,hands. Relevant physical exam findings are noted in the Assessment and Plan.  face,scalp Pink patches with greasy scale.   arms Pink Welts   exts, trunk Scaly erythematous papules and patches +/- dyspigmentation, lichenification, excoriations.    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  It tends to be exacerbated by stress and cooler weather.  People who have neurologic disease may experience new onset or exacerbation of existing seborrheic dermatitis.  The condition is not curable but treatable and can be controlled.   Psoriasis is a chronic non-curable, but treatable genetic/hereditary disease that may have other systemic features affecting other organ systems such as joints (Psoriatic Arthritis). It is associated with an increased risk of inflammatory bowel disease, heart disease, non-alcoholic fatty liver disease, and depression.     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 It is often associated with allergic rhinitis and/or asthma and can require treatment with topical medications, phototherapy, or in severe cases a biologic medication called Dupixent in children and adults.    After pregnancy we may consider Dupixent injection for itch if other options not helpful  Return in about 4 months (around 01/16/2021) for Sebo-psoriasis, eczema .  IAngelique Holm, CMA, am acting as scribe for Armida Sans, MD .  Documentation: I have reviewed the above documentation for accuracy and completeness, and I agree with the above.  Armida Sans, MD

## 2020-09-16 NOTE — Patient Instructions (Signed)

## 2020-09-17 ENCOUNTER — Encounter: Payer: Self-pay | Admitting: Dermatology

## 2020-12-09 IMAGING — CR DG KNEE COMPLETE 4+V*L*
1 series · 4 of 4 positions shown · non-contrast
Comparison: None.

CLINICAL DATA: Patient with left knee pain status post fall.
Initial encounter.

EXAM:
LEFT KNEE - COMPLETE 4+ VIEW

[Series 1: dg knee complete 4 views left · 0.14mm/px · 4 of 4 slices shown]
[im 1/4]
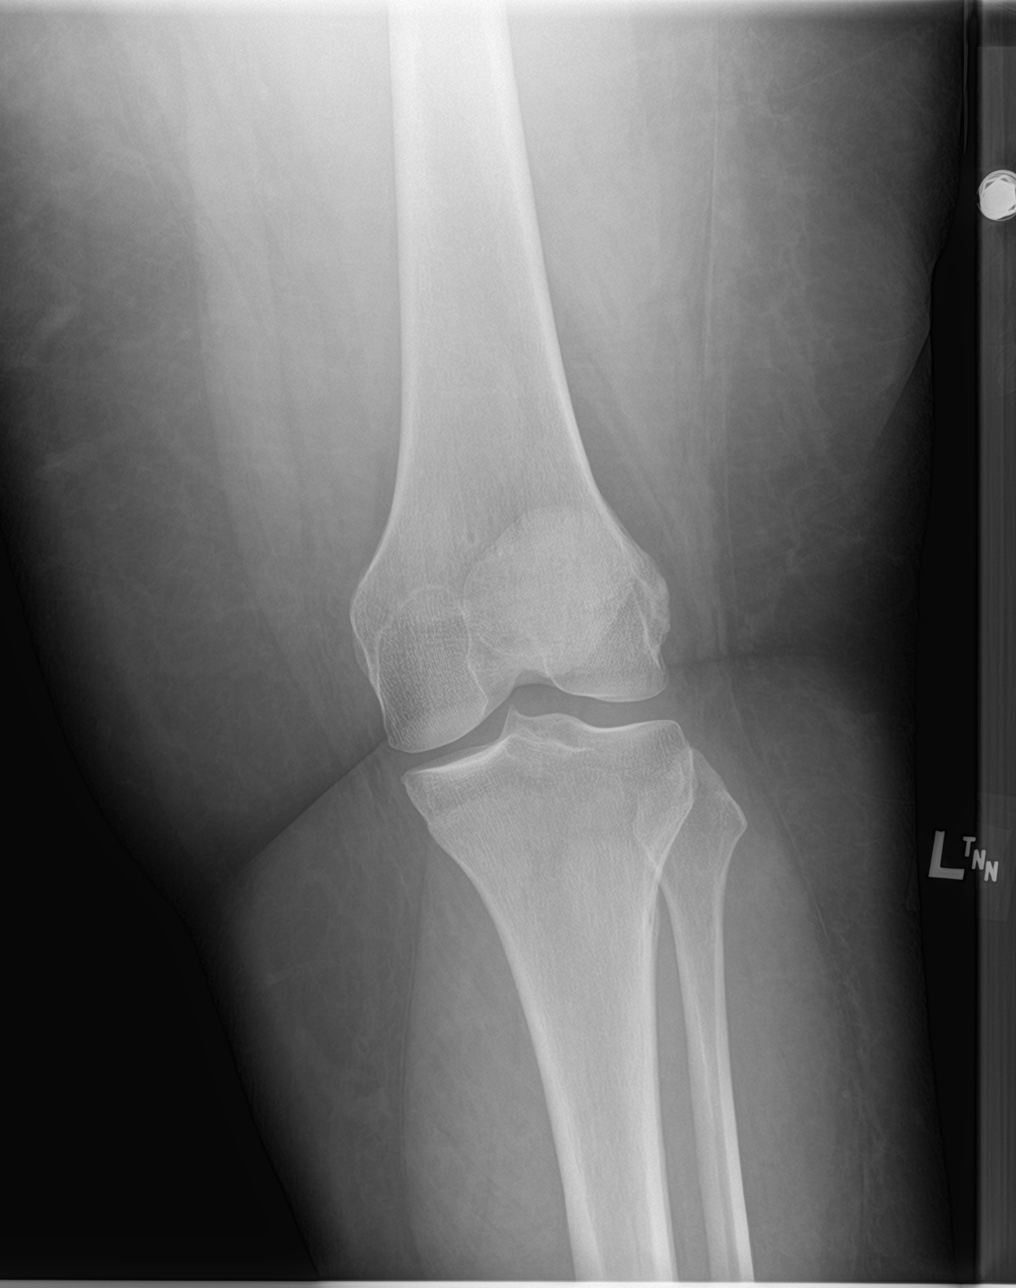
[im 2/4]
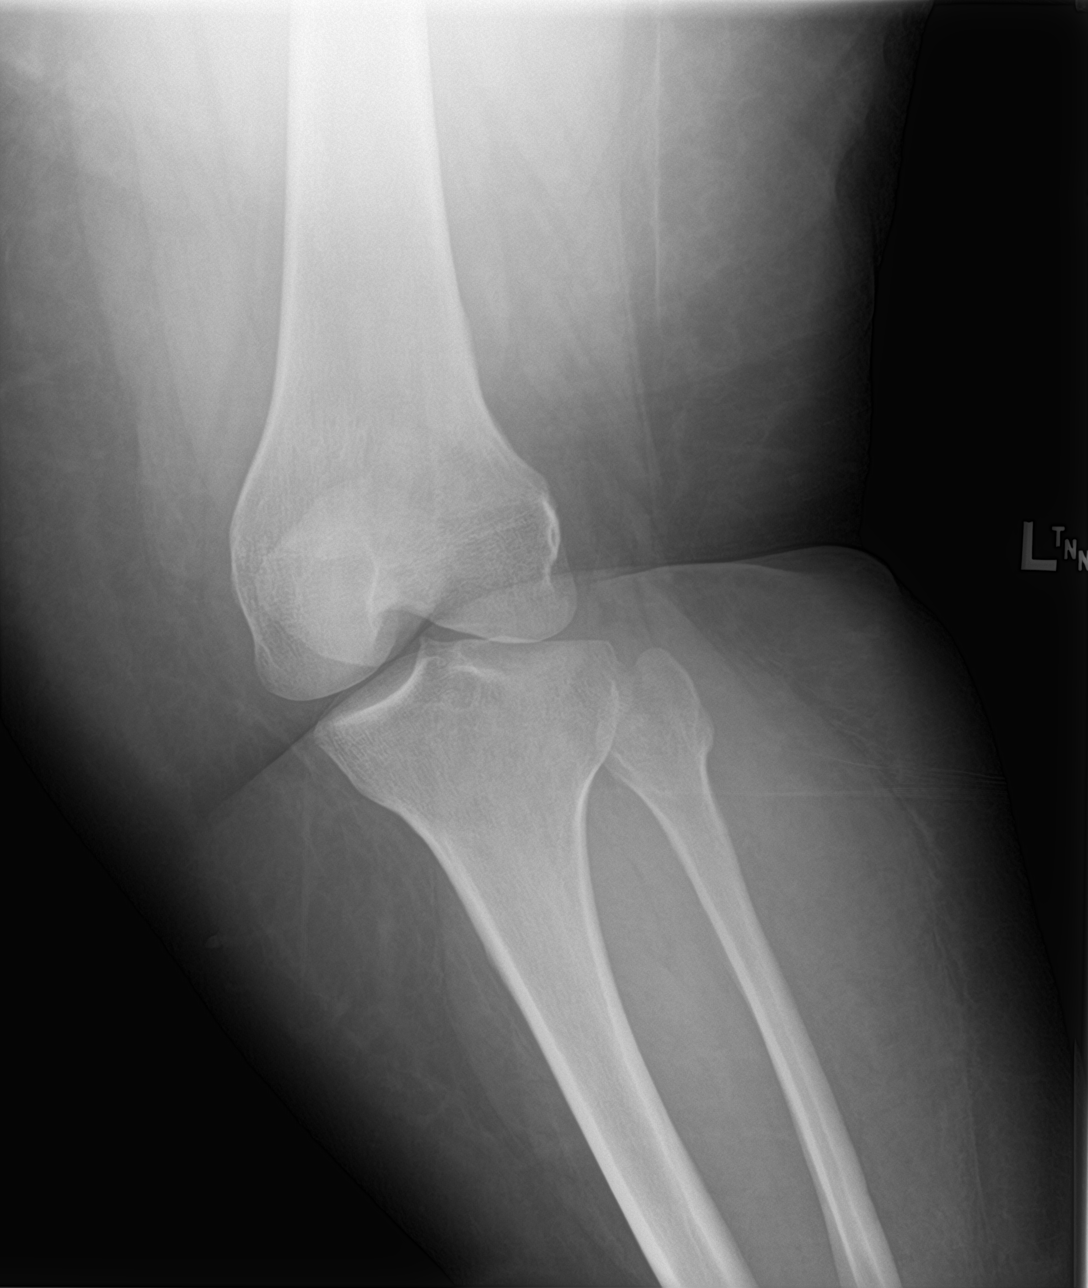
[im 3/4]
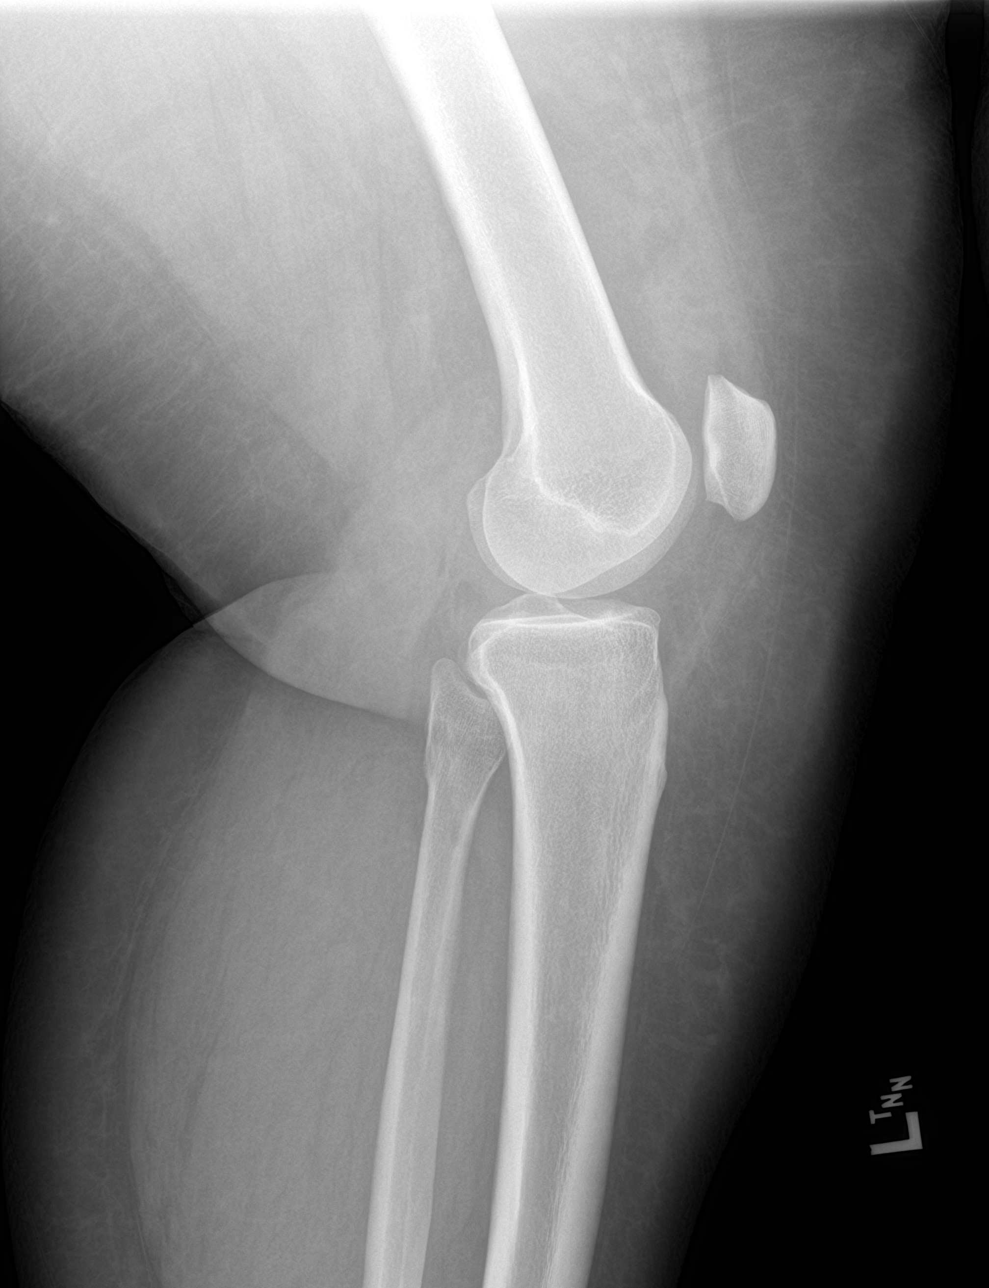
[im 4/4]
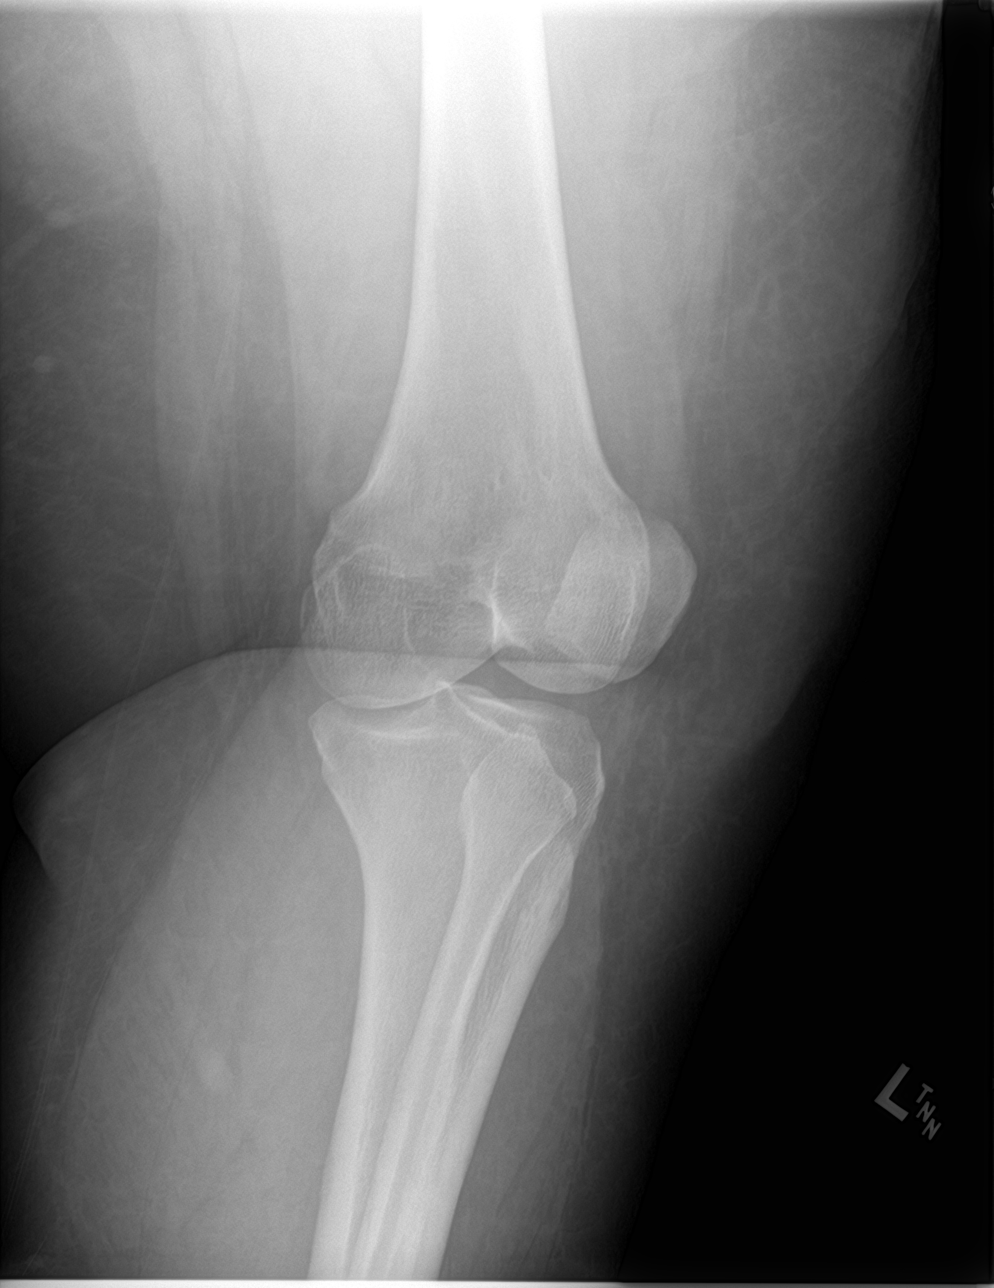

[4 of 4 positions shown; findings below may reference images not displayed]

FINDINGS: Normal anatomic alignment. No evidence for acute fracture or
dislocation. Regional soft tissues unremarkable.
IMPRESSION: No acute osseous abnormality.

## 2020-12-16 ENCOUNTER — Ambulatory Visit: Payer: Medicaid Other | Admitting: Dermatology

## 2021-02-01 ENCOUNTER — Other Ambulatory Visit: Payer: Self-pay

## 2021-02-01 ENCOUNTER — Emergency Department: Payer: Medicaid Other

## 2021-02-01 ENCOUNTER — Emergency Department
Admission: EM | Admit: 2021-02-01 | Discharge: 2021-02-01 | Disposition: A | Discharge status: Home / Self Care | Payer: Medicaid Other | Attending: Emergency Medicine | Admitting: Emergency Medicine

## 2021-02-01 DIAGNOSIS — R509 Fever, unspecified: Secondary | ICD-10-CM | POA: Diagnosis present

## 2021-02-01 DIAGNOSIS — J45909 Unspecified asthma, uncomplicated: Secondary | ICD-10-CM | POA: Diagnosis not present

## 2021-02-01 DIAGNOSIS — U071 COVID-19: Secondary | ICD-10-CM | POA: Insufficient documentation

## 2021-02-01 LAB — RESP PANEL BY RT-PCR (FLU A&B, COVID) ARPGX2
Influenza A by PCR: NEGATIVE
Influenza B by PCR: NEGATIVE
SARS Coronavirus 2 by RT PCR: POSITIVE — AB

## 2021-02-01 MED ORDER — AZITHROMYCIN 250 MG PO TABS: ORAL_TABLET | ORAL | 0 refills | Status: AC

## 2021-02-01 MED ORDER — PREDNISONE 10 MG (21) PO TBPK: ORAL_TABLET | ORAL | 0 refills | Status: AC

## 2021-02-01 NOTE — ED Triage Notes (Signed)
Pt c/o cough with congestion with fever since yesterday

## 2021-02-01 NOTE — ED Provider Notes (Signed)
[facility] Provider Note    Event Date/Time   First MD Initiated Contact with Patient 02/01/21 1340     (approximate)   History   URI   HPI  Emma Hamilton is a 36 y.o. female with history of asthma presents emergency department with fever cough, some shortness of breath.  Started feeling bad yesterday.  Has vaccinated for COVID.  No chest pain.  Children are sick and are positive for COVID      Physical Exam   Triage Vital Signs: ED Triage Vitals  Enc Vitals Group     BP 02/01/21 1111 (!) 159/103     Pulse Rate 02/01/21 1111 (!) 112     Resp 02/01/21 1111 (!) 22     Temp 02/01/21 1111 100.1 F (37.8 C)     Temp Source 02/01/21 1111 Oral     SpO2 02/01/21 1111 97 %     Weight 02/01/21 1340 (!) 363 lb 12.1 oz (165 kg)     Height 02/01/21 1340 5\' 3"  (1.6 m)     Head Circumference --      Peak Flow --      Pain Score --      Pain Loc --      Pain Edu? --      Excl. in GC? --     Most recent vital signs: Vitals:   02/01/21 1111  BP: (!) 159/103  Pulse: (!) 112  Resp: (!) 22  Temp: 100.1 F (37.8 C)  SpO2: 97%     General: Awake, no distress.   CV:  Good peripheral perfusion. regular rate and  rhythm Resp:  Normal effort. Lungs CTA Abd:  No distention.   Other:      ED Results / Procedures / Treatments   Labs (all labs ordered are listed, but only abnormal results are displayed) Labs Reviewed  RESP PANEL BY RT-PCR (FLU A&B, COVID) ARPGX2 - Abnormal; Notable for the following components:      Result Value   SARS Coronavirus 2 by RT PCR POSITIVE (*)    All other components within normal limits     EKG     RADIOLOGY Chest x-ray    PROCEDURES:   Procedures   MEDICATIONS ORDERED IN ED: Medications - No data to display   IMPRESSION / MDM / ASSESSMENT AND PLAN / ED COURSE  I reviewed the triage vital signs and the nursing notes.                              Differential diagnosis includes, but is  not limited to, COVID, influenza, CAP  Patient is a 36 year old female presents with COVID-like symptoms of shortness of breath.  Due to her history of asthma we will do chest x-ray to ensure there is no multifocal pneumonia.  Will place patient on steroids.  She is breast-feeding so she will need to pump and dump for 1 week.  Chest x-ray was reviewed by me does not show any multifocal pneumonia, does show few opacities.  I did explain these findings to the patient.  She is placed on a Z-Pak steroid pack.  She is to quarantine for 5 days that she is vaccinated.  Return emergency department worsening.  Use her inhaler as needed.  She agrees with treatment plan.  She is discharged stable condition.     FINAL CLINICAL IMPRESSION(S) / ED DIAGNOSES   Final diagnoses:  COVID-19     Rx / DC Orders   ED Discharge Orders          Ordered    azithromycin (ZITHROMAX Z-PAK) 250 MG tablet        02/01/21 1444    predniSONE (STERAPRED UNI-PAK 21 TAB) 10 MG (21) TBPK tablet        02/01/21 1444             Note:  This document was prepared using Dragon voice recognition software and may include unintentional dictation errors.    Faythe Ghee, PA-C 02/01/21 1446    Shaune Pollack, MD 02/01/21 [phone]

## 2021-02-01 NOTE — Discharge Instructions (Signed)
Pump and dump the breast milk while on the steroids Follow up with your regular doctor as needed Return to the ER if worsening  Take over-the-counter vitamin C, vitamin D, and zinc to help boost your immune system Take over-the-counter Mucinex for cough as needed Take over-the-counter Tylenol and ibuprofen for fever and body aches Follow-up with your regular doctor as needed Return emergency department worsening Quarantine for 5 days if you are vaccinated and 10 days if you are not.  Anne Shutter is from onset of symptoms.  At that time you may return to school/work if your symptoms have improved and you have been fever free for 24 hours.

## 2021-03-30 ENCOUNTER — Other Ambulatory Visit: Payer: Self-pay | Admitting: Dermatology

## 2021-03-30 DIAGNOSIS — L219 Seborrheic dermatitis, unspecified: Secondary | ICD-10-CM

## 2021-05-25 ENCOUNTER — Other Ambulatory Visit: Payer: Self-pay | Admitting: Dermatology

## 2021-05-25 DIAGNOSIS — L219 Seborrheic dermatitis, unspecified: Secondary | ICD-10-CM

## 2021-06-24 ENCOUNTER — Other Ambulatory Visit: Payer: Self-pay | Admitting: Dermatology

## 2021-06-24 DIAGNOSIS — L219 Seborrheic dermatitis, unspecified: Secondary | ICD-10-CM

## 2021-09-22 ENCOUNTER — Other Ambulatory Visit: Payer: Self-pay | Admitting: Rheumatology

## 2021-09-22 DIAGNOSIS — R9389 Abnormal findings on diagnostic imaging of other specified body structures: Secondary | ICD-10-CM

## 2021-10-08 ENCOUNTER — Ambulatory Visit
Admission: RE | Admit: 2021-10-08 | Discharge: 2021-10-08 | Disposition: A | Discharge status: Home / Self Care | Payer: Medicaid Other | Source: Ambulatory Visit | Attending: Rheumatology | Admitting: Rheumatology

## 2021-10-08 DIAGNOSIS — R9389 Abnormal findings on diagnostic imaging of other specified body structures: Secondary | ICD-10-CM

## 2022-07-10 ENCOUNTER — Emergency Department
Admission: EM | Admit: 2022-07-10 | Discharge: 2022-07-10 | Disposition: A | Discharge status: Home / Self Care | Payer: Medicaid Other | Attending: Emergency Medicine | Admitting: Emergency Medicine

## 2022-07-10 DIAGNOSIS — S61412A Laceration without foreign body of left hand, initial encounter: Secondary | ICD-10-CM

## 2022-07-10 DIAGNOSIS — W25XXXA Contact with sharp glass, initial encounter: Secondary | ICD-10-CM | POA: Diagnosis not present

## 2022-07-10 MED ORDER — LIDOCAINE HCL (PF) 1 % IJ SOLN
10.0000 mL | Freq: Once | INTRAMUSCULAR | Status: AC
Start: 2022-07-10 — End: 2022-07-10
  Administered 2022-07-10: 10 mL
  Filled 2022-07-10: qty 10

## 2022-07-10 NOTE — ED Provider Notes (Signed)
[facility] Provider Note  Patient Contact: 5:53 PM (approximate)   History   Extremity Laceration   HPI  Emma Hamilton is a 37 y.o. female who presents emergency department with lacerations of the dorsal hand proximal to the MCP joint of the index finger.  Patient states that she tripped over her cat, her hand hit a glass door and the glass broke creating laceration.  Bleeding controlled prior to arrival.  Full range of motion to all digits of the hand.  No other injury or complaint.  Tetanus shot is up-to-date.     Physical Exam   Triage Vital Signs: ED Triage Vitals [07/10/22 1737]  Enc Vitals Group     BP (!) 161/100     Pulse Rate (!) 110     Resp 16     Temp 98.2 F (36.8 C)     Temp Source Oral     SpO2 100 %     Weight (!) 380 lb (172.4 kg)     Height 5\' 3"  (1.6 m)     Head Circumference      Peak Flow      Pain Score      Pain Loc      Pain Edu?      Excl. in GC?     Most recent vital signs: Vitals:   07/10/22 1737  BP: (!) 161/100  Pulse: (!) 110  Resp: 16  Temp: 98.2 F (36.8 C)  SpO2: 100%     General: Alert and in no acute distress.  Cardiovascular:  Good peripheral perfusion Respiratory: Normal respiratory effort without tachypnea or retractions. Lungs CTAB.  Musculoskeletal: Full range of motion to all extremities.  Visualization of the left hand reveals 2 linear lacerations, one just proximal to the second and third MCP joint, and the other mid dorsal hand.  Both are relatively superficial in nature but both are roughly 3 cm in length.  Edges are smooth in nature.  No retained foreign body.  Full range of motion to the digits.  Sensation cap refill intact all digits. Neurologic:  No gross focal neurologic deficits are appreciated.  Skin:   No rash noted Other:   ED Results / Procedures / Treatments   Labs (all labs ordered are listed, but only abnormal results are displayed) Labs Reviewed - No data to  display   EKG     RADIOLOGY    No results found.  PROCEDURES:  Critical Care performed: No  ..Laceration Repair  Date/Time: 07/10/2022 6:47 PM  Performed by: Racheal Patches, PA-C Authorized by: Racheal Patches, PA-C   Consent:    Consent obtained:  Verbal   Consent given by:  Patient   Risks discussed:  Infection, pain and poor wound healing Universal protocol:    Procedure explained and questions answered to patient or proxy's satisfaction: yes     Immediately prior to procedure, a time out was called: yes     Patient identity confirmed:  Verbally with patient Anesthesia:    Anesthesia method:  Local infiltration   Local anesthetic:  Lidocaine 1% w/o epi Laceration details:    Location:  Hand   Hand location:  L hand, dorsum   Length (cm):  3 Exploration:    Hemostasis achieved with:  Direct pressure   Wound extent: no foreign body, no nerve damage, no tendon damage and no underlying fracture     Contaminated: no   Treatment:    Area cleansed  with:  Povidone-iodine and saline   Amount of cleaning:  Standard   Irrigation solution:  Sterile saline   Irrigation volume:  Skin repair:    Repair method:  Sutures   Suture size:  4-0   Suture material:  Nylon   Suture technique:  Simple interrupted   Number of sutures:  4 Approximation:    Approximation:  Close Repair type:    Repair type:  Simple Post-procedure details:    Dressing:  Open (no dressing)   Procedure completion:  Tolerated well, no immediate complications .Marland KitchenLaceration Repair  Date/Time: 07/10/2022 6:47 PM  Performed by: Racheal Patches, PA-C Authorized by: Racheal Patches, PA-C   Consent:    Consent obtained:  Verbal   Consent given by:  Patient   Risks discussed:  Infection, pain and poor wound healing Universal protocol:    Procedure explained and questions answered to patient or proxy's satisfaction: yes     Immediately prior to procedure, a time out was  called: yes     Patient identity confirmed:  Verbally with patient Anesthesia:    Anesthesia method:  Local infiltration   Local anesthetic:  Lidocaine 1% w/o epi Laceration details:    Location:  Hand   Hand location:  L hand, dorsum   Length (cm):  3 Exploration:    Hemostasis achieved with:  Direct pressure   Wound exploration: wound explored through full range of motion and entire depth of wound visualized     Wound extent: no foreign body, no nerve damage, no tendon damage and no underlying fracture     Contaminated: no   Treatment:    Area cleansed with:  Povidone-iodine and saline   Amount of cleaning:  Standard   Irrigation volume:  Skin repair:    Repair method:  Sutures   Suture size:  4-0   Suture material:  Nylon   Suture technique:  Simple interrupted   Number of sutures:  4 Approximation:    Approximation:  Close Repair type:    Repair type:  Simple Post-procedure details:    Dressing:  Open (no dressing)   Procedure completion:  Tolerated well, no immediate complications    MEDICATIONS ORDERED IN ED: Medications  lidocaine (PF) (XYLOCAINE) 1 % injection 10 mL (10 mLs Infiltration Given 07/10/22 1807)     IMPRESSION / MDM / ASSESSMENT AND PLAN / ED COURSE  I reviewed the triage vital signs and the nursing notes.                                 Differential diagnosis includes, but is not limited to, laceration, tendon laceration, retained foreign body   Patient's presentation is most consistent with acute presentation with potential threat to life or bodily function.   Patient's diagnosis is consistent with hand laceration.  Patient presents emergency department after tripping over her cat, putting her hand through glass door.  Patient had 2 lacerations to the dorsal left hand that both required sutures.  Repair was performed as described above.  Patient tolerated well.  Wound care instructions discussed with the patient.  Follow-up primary care  in a week for suture removal.. Patient is given ED precautions to return to the ED for any worsening or new symptoms.     FINAL CLINICAL IMPRESSION(S) / ED DIAGNOSES   Final diagnoses:  Laceration of left hand without foreign body, initial encounter  Rx / DC Orders   ED Discharge Orders     None        Note:  This document was prepared using Dragon voice recognition software and may include unintentional dictation errors.   Lanette Hampshire 07/10/22 1851    Georga Hacking, MD 07/10/22 1900

## 2022-07-10 NOTE — ED Triage Notes (Signed)
Pt has laceration on index finger/hand area on knuckle. Pt states she tripped and her hand went through a window.

## 2023-11-20 ENCOUNTER — Emergency Department
Admission: EM | Admit: 2023-11-20 | Discharge: 2023-11-20 | Disposition: A | Discharge status: Home / Self Care | Payer: Self-pay | Attending: Emergency Medicine | Admitting: Emergency Medicine

## 2023-11-20 ENCOUNTER — Emergency Department: Payer: Self-pay

## 2023-11-20 ENCOUNTER — Other Ambulatory Visit: Payer: Self-pay

## 2023-11-20 ENCOUNTER — Encounter: Payer: Self-pay | Admitting: Emergency Medicine

## 2023-11-20 DIAGNOSIS — M79602 Pain in left arm: Secondary | ICD-10-CM | POA: Insufficient documentation

## 2023-11-20 DIAGNOSIS — I10 Essential (primary) hypertension: Secondary | ICD-10-CM | POA: Insufficient documentation

## 2023-11-20 DIAGNOSIS — J45909 Unspecified asthma, uncomplicated: Secondary | ICD-10-CM | POA: Insufficient documentation

## 2023-11-20 DIAGNOSIS — M5412 Radiculopathy, cervical region: Secondary | ICD-10-CM | POA: Insufficient documentation

## 2023-11-20 LAB — CBC
HCT: 37.8 % (ref 36.0–46.0)
Hemoglobin: 12.1 g/dL (ref 12.0–15.0)
MCH: 28.1 pg (ref 26.0–34.0)
MCHC: 32 g/dL (ref 30.0–36.0)
MCV: 87.9 fL (ref 80.0–100.0)
Platelets: 258 K/uL (ref 150–400)
RBC: 4.3 MIL/uL (ref 3.87–5.11)
RDW: 13.7 % (ref 11.5–15.5)
WBC: 10.2 K/uL (ref 4.0–10.5)
nRBC: 0 % (ref 0.0–0.2)

## 2023-11-20 LAB — TROPONIN I (HIGH SENSITIVITY)
Troponin I (High Sensitivity): 3 ng/L (ref <18)
Troponin I (High Sensitivity): 3 ng/L (ref <18)

## 2023-11-20 LAB — BASIC METABOLIC PANEL WITH GFR
Anion gap: 11 (ref 5–15)
BUN: 22 mg/dL — ABNORMAL HIGH (ref 6–20)
CO2: 24 mmol/L (ref 22–32)
Calcium: 9.2 mg/dL (ref 8.9–10.3)
Chloride: 104 mmol/L (ref 98–111)
Creatinine, Ser: 0.77 mg/dL (ref 0.44–1.00)
GFR, Estimated: >60 mL/min (ref >60)
Glucose, Bld: 122 mg/dL — ABNORMAL HIGH (ref 70–99)
Potassium: 3.6 mmol/L (ref 3.5–5.1)
Sodium: 139 mmol/L (ref 135–145)

## 2023-11-20 MED ORDER — LIDOCAINE 5 % EX PTCH
1.0000 | MEDICATED_PATCH | Freq: Two times a day (BID) | CUTANEOUS | 0 refills | Status: AC
  Filled 2023-11-20: qty 10, 5d supply, fill #0

## 2023-11-20 MED ORDER — KETOROLAC TROMETHAMINE 30 MG/ML IJ SOLN
30.0000 mg | Freq: Once | INTRAMUSCULAR | Status: AC
  Administered 2023-11-20: 30 mg via INTRAMUSCULAR
  Filled 2023-11-20: qty 1

## 2023-11-20 MED ORDER — CYCLOBENZAPRINE HCL 5 MG PO TABS
5.0000 mg | ORAL_TABLET | Freq: Three times a day (TID) | ORAL | 0 refills | Status: AC | PRN
  Filled 2023-11-20: qty 12, 4d supply, fill #0

## 2023-11-20 NOTE — ED Provider Notes (Signed)
 2  [facility] Provider Note    Event Date/Time   First MD Initiated Contact with Patient 11/20/23 2131     (approximate)   History   Chief Complaint Arm Pain   HPI  Garima Gertha Lichtenberg is a 38 y.o. female with past medical history of hypertension, asthma, and hidradenitis who presents to the ED complaining of arm pain.  Patient reports that just prior to arrival she had acute onset of pain shooting down her left arm into her hand.  She states that she had been dealing with intermittent pain in her neck throughout the day but the arm pain was new.  Pain came on while she was sitting and watching TV, she denies any associated chest pain or shortness of breath.  She has never had similar symptoms in the past and denies any numbness or weakness in her arm.  She does report noticing some redness and swelling in the arm earlier, but this has since resolved.     Physical Exam   Triage Vital Signs: ED Triage Vitals [11/20/23 2007]  Encounter Vitals Group     BP (!) 167/95     Girls Systolic BP Percentile      Girls Diastolic BP Percentile      Boys Systolic BP Percentile      Boys Diastolic BP Percentile      Pulse Rate (!) 101     Resp 18     Temp 98.6 F (37 C)     Temp Source Oral     SpO2 100 %     Weight      Height      Head Circumference      Peak Flow      Pain Score 8     Pain Loc      Pain Education      Exclude from Growth Chart     Most recent vital signs: Vitals:   11/20/23 2007  BP: (!) 167/95  Pulse: (!) 101  Resp: 18  Temp: 98.6 F (37 C)  SpO2: 100%    Constitutional: Alert and oriented. Eyes: Conjunctivae are normal. Head: Atraumatic. Nose: No congestion/rhinnorhea. Mouth/Throat: Mucous membranes are moist.  Cardiovascular: Normal rate, regular rhythm. Grossly normal heart sounds.  2+ radial pulses bilaterally. Respiratory: Normal respiratory effort.  No retractions. Lungs CTAB. Gastrointestinal: Soft and nontender.  No distention. Musculoskeletal: No lower extremity tenderness nor edema.  No edema, warmth, or tenderness noted to left upper extremity.  Range of motion to left upper extremity intact without pain. Neurologic:  Normal speech and language. No gross focal neurologic deficits are appreciated.    ED Results / Procedures / Treatments   Labs (all labs ordered are listed, but only abnormal results are displayed) Labs Reviewed  BASIC METABOLIC PANEL WITH GFR - Abnormal; Notable for the following components:      Result Value   Glucose, Bld 122 (*)    BUN 22 (*)    All other components within normal limits  CBC  TROPONIN I (HIGH SENSITIVITY)  TROPONIN I (HIGH SENSITIVITY)     EKG  ED ECG REPORT I, Carlin Palin, the attending physician, personally viewed and interpreted this ECG.   Date: 11/20/2023  EKG Time: 20:12  Rate: 91  Rhythm: normal sinus rhythm  Axis: Normal  Intervals:none  ST&T Change: None  RADIOLOGY Chest x-ray reviewed and interpreted by me with no infiltrate, edema, or effusion.  PROCEDURES:  Critical Care performed: No  Procedures  MEDICATIONS ORDERED IN ED: Medications  ketorolac  (TORADOL ) 30 MG/ML injection 30 mg (30 mg Intramuscular Given 11/20/23 2151)     IMPRESSION / MDM / ASSESSMENT AND PLAN / ED COURSE  I reviewed the triage vital signs and the nursing notes.                              38 y.o. female with past medical history of hypertension, asthma, and hidradenitis who presents to the ED complaining of acute onset of pain shooting down her left arm just prior to arrival.  Patient's presentation is most consistent with acute presentation with potential threat to life or bodily function.  Differential diagnosis includes, but is not limited to, ACS, arterial insufficiency, radiculopathy, DVT, cellulitis.  Patient well-appearing and in no acute distress, vital signs remarkable for hypertension but otherwise reassuring.  EKG shows no  evidence of arrhythmia or ischemia, troponin within normal limits, and symptoms seem atypical for ACS.  Chest x-ray is unremarkable and additional labs are reassuring without significant anemia, leukocytosis, electrolyte abnormality, or AKI.  She is neurovascularly intact to her left upper extremity with no evidence of DVT or infection.  With occasional neck pain and pain described as shooting down her left arm, suspect cervical radiculopathy.    Second set troponin within normal limits and patient appropriate for outpatient management with symptomatic treatment.  She was counseled to follow-up with her PCP and to return to the ED for new or worsening symptoms.  Patient agrees with plan.      FINAL CLINICAL IMPRESSION(S) / ED DIAGNOSES   Final diagnoses:  Left arm pain  Cervical radiculopathy     Rx / DC Orders   ED Discharge Orders          Ordered    cyclobenzaprine (FLEXERIL) 5 MG tablet  3 times daily PRN        11/20/23 2238    lidocaine  (LIDODERM ) 5 %  Every 12 hours        11/20/23 2238             Note:  This document was prepared using Dragon voice recognition software and may include unintentional dictation errors.   Willo Dunnings, MD 11/20/23 2244

## 2023-11-20 NOTE — ED Triage Notes (Signed)
 Pt arrives POV, ambulatory to triage, gait steady no acute distress noted c/o sudden onset of left arm pain while watching TV. Pt says pain is from her hand to her shoulder, denies injury. Pt also reports redness/ swelling to arm. Denies sob, cp, or dizziness.

## 2023-11-21 ENCOUNTER — Other Ambulatory Visit: Payer: Self-pay

## 2023-11-22 ENCOUNTER — Other Ambulatory Visit: Payer: Self-pay
